# Patient Record
Sex: Female | Born: 1960 | Race: Black or African American | Hispanic: No | Marital: Single | Smoking: Never smoker
Health system: Southern US, Community
[De-identification: ages and names within clinical notes are randomized; demographics above are authoritative.]

## PROBLEM LIST (undated history)

## (undated) DIAGNOSIS — R296 Repeated falls: Secondary | ICD-10-CM

## (undated) DIAGNOSIS — I10 Essential (primary) hypertension: Secondary | ICD-10-CM

## (undated) HISTORY — PX: OVARIAN CYST REMOVAL: SHX89

## (undated) HISTORY — PX: TONSILLECTOMY: SUR1361

## (undated) HISTORY — PX: MANDIBLE SURGERY: SHX707

---

## 2004-04-26 ENCOUNTER — Other Ambulatory Visit: Admission: RE | Admit: 2004-04-26 | Discharge: 2004-04-26 | Payer: Self-pay | Admitting: Obstetrics and Gynecology

## 2014-09-10 ENCOUNTER — Encounter (HOSPITAL_BASED_OUTPATIENT_CLINIC_OR_DEPARTMENT_OTHER): Payer: Self-pay | Admitting: *Deleted

## 2014-09-10 ENCOUNTER — Emergency Department (HOSPITAL_BASED_OUTPATIENT_CLINIC_OR_DEPARTMENT_OTHER)
Admission: EM | Admit: 2014-09-10 | Discharge: 2014-09-10 | Disposition: A | Discharge status: Home / Self Care | Payer: Self-pay | Attending: Emergency Medicine | Admitting: Emergency Medicine

## 2014-09-10 DIAGNOSIS — Z79899 Other long term (current) drug therapy: Secondary | ICD-10-CM | POA: Insufficient documentation

## 2014-09-10 DIAGNOSIS — L03012 Cellulitis of left finger: Secondary | ICD-10-CM | POA: Insufficient documentation

## 2014-09-10 DIAGNOSIS — IMO0001 Reserved for inherently not codable concepts without codable children: Secondary | ICD-10-CM

## 2014-09-10 DIAGNOSIS — I10 Essential (primary) hypertension: Secondary | ICD-10-CM | POA: Insufficient documentation

## 2014-09-10 HISTORY — DX: Essential (primary) hypertension: I10

## 2014-09-10 MED ORDER — LIDOCAINE HCL 2 % IJ SOLN
10.0000 mL | Freq: Once | INTRAMUSCULAR | Status: AC
  Administered 2014-09-10: 200 mg
  Filled 2014-09-10: qty 20

## 2014-09-10 NOTE — ED Notes (Signed)
C/o pain and swelling to middle finger left hand x 3 days

## 2014-09-10 NOTE — ED Notes (Signed)
Pt c/o left 3rd finger swelling and redness x 3 days

## 2014-09-10 NOTE — ED Provider Notes (Signed)
CSN: [id number]     Arrival date & time 09/10/14  2245 History  This chart was scribed for Wynetta Fines, MD by Tula Nakayama, ED Scribe. This patient was seen in room MH02/MH02 and the patient's care was started at 11:15 PM.    Chief Complaint  Patient presents with  . Finger Pain    The history is provided by the patient. No language interpreter was used.   HPI Comments: Teresa Cruz is a 54 y.o. female with a history of HTN who presents to the Emergency Department complaining of gradual-onset swelling and pain in the tip of her left 3rd finger adjacent to the nail. It started 3 days ago. She states symptoms started after she cut her fingernails. Pt notes a history of paronychias in the past which are sometimes relieved by soaking the affected area in salt water; soaking did not help in this instance. She has had I&D of similar abscesses in the past. Pt denies fever and chills as associated symptoms. Her pain radiates proximally up her left hand.  Past Medical History  Diagnosis Date  . Hypertension    History reviewed. No pertinent past surgical history. History reviewed. No pertinent family history. History  Substance Use Topics  . Smoking status: Never Smoker   . Smokeless tobacco: Not on file  . Alcohol Use: No   OB History    No data available     Review of Systems  A complete 10 system review of systems was obtained and all systems are negative except as noted in the HPI and PMH.   Allergies  Review of patient's allergies indicates no known allergies.  Home Medications   Prior to Admission medications   Medication Sig Start Date End Date Taking? Authorizing Provider  hydrochlorothiazide (MICROZIDE) 12.5 MG capsule Take 12.5 mg by mouth daily.   Yes Historical Provider, MD  lisinopril (PRINIVIL,ZESTRIL) 10 MG tablet Take 10 mg by mouth daily.   Yes Historical Provider, MD   BP 179/95 mmHg  Pulse 98  Temp(Src) 99.5 F (37.5 C) (Oral)  Resp 20  Ht 5\' 6"  (1.676  m)  Wt 400 lb (181.439 kg)  BMI 64.59 kg/m2  SpO2 98%   Physical Exam  Nursing note and vitals reviewed. General: Well-developed, morbidly obese female in no acute distress; appearance consistent with age of record HENT: normocephalic; atraumatic Eyes: normal appearance Neck: supple Heart: regular rate and rhythm Lungs: normal respiratory effort and excursion Extremities: No deformity; full range of motion; moderate-to-severe tenderness to left 3rd finger with swelling adjacent to ulnar side of nail, no evidence of felon, no cellulitis or lymphangitis Neurologic: Awake, alert and oriented; motor function intact in all extremities and symmetric; no facial droop Skin: Warm and dry Psychiatric: Normal mood and affect   ED Course  Procedures (including critical care time) DIAGNOSTIC STUDIES: Oxygen Saturation is 98% on RA, normal by my interpretation.    COORDINATION OF CARE: 11:22 PM Discussed treatment plan with pt which includes I&D. She agreed to plan.  INCISION AND DRAINAGE Performed by: Shanon Rosser L Consent: Verbal consent obtained. Risks and benefits: risks, benefits and alternatives were discussed Type: Paronychia   Body area: Left middle finger  Anesthesia: Partial digital block  Incision was made with a scalpel.  Local anesthetic: lidocaine 2 % without epinephrine  Anesthetic total: 2 ml  Complexity: complex Blunt dissection to break up loculations  Drainage: purulent  Drainage amount: Copious; foul-smelling   Packing material: none  Patient tolerance: Patient tolerated the  procedure well with no immediate complications.     MDM   Final diagnoses:  Paronychia of third finger of left hand   I personally performed the services described in this documentation, which was scribed in my presence. The recorded information has been reviewed and is accurate.   Wynetta Fines, MD 09/10/14 [phone]

## 2014-09-10 NOTE — Discharge Instructions (Signed)

## 2014-09-11 NOTE — ED Notes (Signed)
antibiotic ointment applied to finger w a 2x2 gauge and 1 inch Kling finger tip dressing applied

## 2015-08-30 ENCOUNTER — Emergency Department (HOSPITAL_BASED_OUTPATIENT_CLINIC_OR_DEPARTMENT_OTHER)
Admission: EM | Admit: 2015-08-30 | Discharge: 2015-08-30 | Disposition: A | Discharge status: Home / Self Care | Payer: Self-pay | Attending: Emergency Medicine | Admitting: Emergency Medicine

## 2015-08-30 ENCOUNTER — Encounter (HOSPITAL_BASED_OUTPATIENT_CLINIC_OR_DEPARTMENT_OTHER): Payer: Self-pay | Admitting: *Deleted

## 2015-08-30 ENCOUNTER — Emergency Department (HOSPITAL_BASED_OUTPATIENT_CLINIC_OR_DEPARTMENT_OTHER): Payer: Self-pay

## 2015-08-30 DIAGNOSIS — Z79899 Other long term (current) drug therapy: Secondary | ICD-10-CM | POA: Insufficient documentation

## 2015-08-30 DIAGNOSIS — H538 Other visual disturbances: Secondary | ICD-10-CM | POA: Insufficient documentation

## 2015-08-30 DIAGNOSIS — H00011 Hordeolum externum right upper eyelid: Secondary | ICD-10-CM | POA: Insufficient documentation

## 2015-08-30 DIAGNOSIS — H00013 Hordeolum externum right eye, unspecified eyelid: Secondary | ICD-10-CM

## 2015-08-30 DIAGNOSIS — I10 Essential (primary) hypertension: Secondary | ICD-10-CM | POA: Insufficient documentation

## 2015-08-30 LAB — CBC WITH DIFFERENTIAL/PLATELET
BASOS PCT: 0 %
Basophils Absolute: 0 10*3/uL (ref 0.0–0.1)
EOS ABS: 0.3 10*3/uL (ref 0.0–0.7)
EOS PCT: 2 %
HCT: 38.2 % (ref 36.0–46.0)
Hemoglobin: 12.3 g/dL (ref 12.0–15.0)
LYMPHS ABS: 2.1 10*3/uL (ref 0.7–4.0)
Lymphocytes Relative: 19 %
MCH: 28 pg (ref 26.0–34.0)
MCHC: 32.2 g/dL (ref 30.0–36.0)
MCV: 87 fL (ref 78.0–100.0)
Monocytes Absolute: 1 10*3/uL (ref 0.1–1.0)
Monocytes Relative: 9 %
Neutro Abs: 7.8 10*3/uL — ABNORMAL HIGH (ref 1.7–7.7)
Neutrophils Relative %: 70 %
Platelets: 384 10*3/uL (ref 150–400)
RBC: 4.39 MIL/uL (ref 3.87–5.11)
RDW: 15.3 % (ref 11.5–15.5)
WBC: 11.2 10*3/uL — AB (ref 4.0–10.5)

## 2015-08-30 LAB — BASIC METABOLIC PANEL
Anion gap: 8 (ref 5–15)
BUN: 14 mg/dL (ref 6–20)
CALCIUM: 8.7 mg/dL — AB (ref 8.9–10.3)
CHLORIDE: 107 mmol/L (ref 101–111)
CO2: 25 mmol/L (ref 22–32)
CREATININE: 1.24 mg/dL — AB (ref 0.44–1.00)
GFR calc Af Amer: 56 mL/min — ABNORMAL LOW (ref >60)
GFR calc non Af Amer: 48 mL/min — ABNORMAL LOW (ref >60)
Glucose, Bld: 112 mg/dL — ABNORMAL HIGH (ref 65–99)
Potassium: 3.5 mmol/L (ref 3.5–5.1)
SODIUM: 140 mmol/L (ref 135–145)

## 2015-08-30 MED ORDER — LISINOPRIL 10 MG PO TABS: 10.0000 mg | ORAL_TABLET | Freq: Every day | ORAL | Status: DC

## 2015-08-30 MED ORDER — ERYTHROMYCIN 5 MG/GM OP OINT: TOPICAL_OINTMENT | Freq: Three times a day (TID) | OPHTHALMIC | Status: AC

## 2015-08-30 MED ORDER — ERYTHROMYCIN 5 MG/GM OP OINT
TOPICAL_OINTMENT | Freq: Once | OPHTHALMIC | Status: AC
  Administered 2015-08-30: 1 via OPHTHALMIC
  Filled 2015-08-30: qty 3.5

## 2015-08-30 NOTE — ED Notes (Signed)
Sty to right eye x 3-4 weeks. Denies pain. Reports drainage while she sleeps

## 2015-08-30 NOTE — ED Notes (Signed)
Pt states she has a swollen area on rt eye lid, onset approx 3 weeks ago, states on occasion in am has some drainage from area, denies any pain at this time. States has not had any vision issues

## 2015-08-30 NOTE — ED Notes (Signed)
DC instructions reviewed with pt along with both Rx as written by EDP, pt teaching done how to use eye oint, pt was able to return demonstration, enforced washing hands before and after eye oint application. Opportunity for questions provided. Teach Back Method used

## 2015-08-30 NOTE — ED Notes (Signed)
Pt reports vision has been blurred in right eye today

## 2015-08-30 NOTE — ED Provider Notes (Signed)
CSN: [id number]     Arrival date & time 08/30/15  1523 History  By signing my name below, I, Helane Gunther, attest that this documentation has been prepared under the direction and in the presence of Julianne Rice, MD. Electronically Signed: Helane Gunther, ED Scribe. 08/30/2015. 4:08 PM.    Chief Complaint  Patient presents with  . Eye Drainage  . Blurred Vision   The history is provided by the patient. No language interpreter was used.   HPI Comments: Teresa Cruz is a 55 y.o. female with a PMHx of HTN who presents to the Emergency Department complaining of intermittent right eye drainage onset 1 month ago, as well as intermittent 5 minute episodes of blurry vision in the right eye beginning today. She notes the drainage usually occurs over night, and the blurred vision occurred while looking at a computer screen. She also endorses a swollen area on the right upper eye lid. She has applied warm compresses with mild relief. She notes she is supposed to be on blood pressure medication, but has not taken any for nearly a year, since she does not have insurance and cannot afford it. She deneis any recent blood sugar tests.   Past Medical History  Diagnosis Date  . Hypertension    Past Surgical History  Procedure Laterality Date  . Ovarian cyst removal    . Tonsillectomy    . Mandible surgery     No family history on file. Social History  Substance Use Topics  . Smoking status: Never Smoker   . Smokeless tobacco: Never Used  . Alcohol Use: No   OB History    No data available     Review of Systems  Constitutional: Negative for fever and chills.  Eyes: Positive for discharge and visual disturbance.  Respiratory: Negative for shortness of breath.   Cardiovascular: Negative for chest pain.  Gastrointestinal: Negative for nausea, vomiting and abdominal pain.  Musculoskeletal: Negative for back pain, neck pain and neck stiffness.  Skin: Negative for rash and wound.   Neurological: Negative for dizziness, syncope, weakness, light-headedness, numbness and headaches.  All other systems reviewed and are negative.   Allergies  Review of patient's allergies indicates no known allergies.  Home Medications   Prior to Admission medications   Medication Sig Start Date End Date Taking? Authorizing Provider  erythromycin ophthalmic ointment Place into the left eye 3 (three) times daily. 08/30/15 09/06/15  Julianne Rice, MD  hydrochlorothiazide (MICROZIDE) 12.5 MG capsule Take 12.5 mg by mouth daily.    Historical Provider, MD  lisinopril (PRINIVIL,ZESTRIL) 10 MG tablet Take 1 tablet (10 mg total) by mouth daily. 08/30/15   Julianne Rice, MD   BP 146/85 mmHg  Pulse 75  Temp(Src) 98.4 F (36.9 C) (Oral)  Resp 20  Ht 5\' 6"  (1.676 m)  Wt 360 lb (163.295 kg)  BMI 58.13 kg/m2  SpO2 100% Physical Exam  Constitutional: She is oriented to person, place, and time. She appears well-developed and well-nourished. No distress.  HENT:  Head: Normocephalic and atraumatic.  Mouth/Throat: Oropharynx is clear and moist. No oropharyngeal exudate.  Eyes: EOM are normal. Pupils are equal, round, and reactive to light. Lids are everted and swept, no foreign bodies found. Right eye exhibits hordeolum. Right eye exhibits no discharge and no exudate. No foreign body present in the right eye. Left eye exhibits no discharge, no exudate and no hordeolum. No foreign body present in the left eye. Right conjunctiva is not injected. Right conjunctiva has no  hemorrhage. Left conjunctiva is not injected. Left conjunctiva has no hemorrhage. Right eye exhibits normal extraocular motion and no nystagmus. Left eye exhibits normal extraocular motion and no nystagmus.    Retinal exam without obvious abnormality though difficult to visualize thoroughly without pupillary dilation  Neck: Normal range of motion. Neck supple.  Cardiovascular: Normal rate and regular rhythm.  Exam reveals no gallop  and no friction rub.   No murmur heard. Pulmonary/Chest: Effort normal and breath sounds normal. No respiratory distress. She has no wheezes. She has no rales.  Abdominal: Soft. Bowel sounds are normal. She exhibits no distension and no mass. There is no tenderness. There is no rebound and no guarding.  Musculoskeletal: Normal range of motion. She exhibits no edema or tenderness.  Lymphadenopathy:    She has no cervical adenopathy.  Neurological: She is alert and oriented to person, place, and time.  Patient is alert and oriented x3 with clear, goal oriented speech. Patient has 5/5 motor in all extremities. Sensation is intact to light touch. Bilateral finger-to-nose is normal with no signs of dysmetria. Patient has a normal gait and walks without assistance.  Skin: Skin is warm and dry. No rash noted. No erythema.  Psychiatric: She has a normal mood and affect. Her behavior is normal.  Nursing note and vitals reviewed.   ED Course  Procedures  DIAGNOSTIC STUDIES: Oxygen Saturation is 99% on RA, normal by my interpretation.    COORDINATION OF CARE: 4:06 PM - Discussed plans to order diagnostic studies. Pt advised of plan for treatment and pt agrees.  Labs Review Labs Reviewed  BASIC METABOLIC PANEL - Abnormal; Notable for the following:    Glucose, Bld 112 (*)    Creatinine, Ser 1.24 (*)    Calcium 8.7 (*)    GFR calc non Af Amer 48 (*)    GFR calc Af Amer 56 (*)    All other components within normal limits  CBC WITH DIFFERENTIAL/PLATELET - Abnormal; Notable for the following:    WBC 11.2 (*)    Neutro Abs 7.8 (*)    All other components within normal limits    Imaging Review Ct Head Wo Contrast  08/30/2015  CLINICAL DATA:  Headaches, dizziness and elevated blood pressure. EXAM: CT HEAD WITHOUT CONTRAST TECHNIQUE: Contiguous axial images were obtained from the base of the skull through the vertex without intravenous contrast. COMPARISON:  None. FINDINGS: The ventricles are  normal in size and configuration. No extra-axial fluid collections are identified. The gray-white differentiation is normal. No CT findings for acute intracranial process such as hemorrhage or infarction. No mass lesions. The brainstem and cerebellum are grossly normal. The bony structures are intact. The paranasal sinuses and mastoid air cells are clear. The globes are intact. A small stye is noted involving the lower right eyelid. IMPRESSION: Normal head CT. Electronically Signed   By: Marijo Sanes M.D.   On: 08/30/2015 16:48   I have personally reviewed and evaluated these images and lab results as part of my medical decision-making.   EKG Interpretation None      MDM   Final diagnoses:  Hordeolum external, right  Essential hypertension  Blurred vision    I personally performed the services described in this documentation, which was scribed in my presence. The recorded information has been reviewed and is accurate.   CT head without any abnormalities that it identified. Patient maintains a normal neurologic exam. We will start on erythromycin ointment for her hordeolum revised to continue warm  compresses. We'll also start back on the patient's lisinopril. She's been advised to follow-up with a chronic care physician as well as an ophthalmologist. States that she had been wearing prescription glasses but she is no longer using them. Return precautions given.  Julianne Rice, MD 08/30/15 [phone]

## 2015-08-30 NOTE — Discharge Instructions (Signed)
Stye A stye is a bump on your eyelid caused by a bacterial infection. A stye can form inside the eyelid (internal stye) or outside the eyelid (external stye). An internal stye may be caused by an infected oil-producing gland inside your eyelid. An external stye may be caused by an infection at the base of your eyelash (hair follicle). Styes are very common. Anyone can get them at any age. They usually occur in just one eye, but you may have more than one in either eye.  CAUSES  The infection is almost always caused by bacteria called Staphylococcus aureus. This is a common type of bacteria that lives on your skin. RISK FACTORS You may be at higher risk for a stye if you have had one before. You may also be at higher risk if you have:  Diabetes.  Long-term illness.  Long-term eye redness.  A skin condition called seborrhea.  High fat levels in your blood (lipids). SIGNS AND SYMPTOMS  Eyelid pain is the most common symptom of a stye. Internal styes are more painful than external styes. Other signs and symptoms may include:  Painful swelling of your eyelid.  A scratchy feeling in your eye.  Tearing and redness of your eye.  Pus draining from the stye. DIAGNOSIS  Your health care provider may be able to diagnose a stye just by examining your eye. The health care provider may also check to make sure:  You do not have a fever or other signs of a more serious infection.  The infection has not spread to other parts of your eye or areas around your eye. TREATMENT  Most styes will clear up in a few days without treatment. In some cases, you may need to use antibiotic drops or ointment to prevent infection. Your health care provider may have to drain the stye surgically if your stye is:  Large.  Causing a lot of pain.  Interfering with your vision. This can be done using a thin blade or a needle.  HOME CARE INSTRUCTIONS   Take medicines only as directed by your health care  provider.  Apply a clean, warm compress to your eye for 10 minutes, 4 times a day.  Do not wear contact lenses or eye makeup until your stye has healed.  Do not try to pop or drain the stye. SEEK MEDICAL CARE IF:  You have chills or a fever.  Your stye does not go away after several days.  Your stye affects your vision.  Your eyeball becomes swollen, red, or painful. MAKE SURE YOU:  Understand these instructions.  Will watch your condition.  Will get help right away if you are not doing well or get worse.   This information is not intended to replace advice given to you by your health care provider. Make sure you discuss any questions you have with your health care provider.   Document Released: 05/04/2005 Document Revised: 08/15/2014 Document Reviewed: 11/08/2013 Elsevier Interactive Patient Education 2016 [employer].  Hypertension Hypertension, commonly called high blood pressure, is when the force of blood pumping through your arteries is too strong. Your arteries are the blood vessels that carry blood from your heart throughout your body. A blood pressure reading consists of a higher number over a lower number, such as 110/72. The higher number (systolic) is the pressure inside your arteries when your heart pumps. The lower number (diastolic) is the pressure inside your arteries when your heart relaxes. Ideally you want your blood pressure below  120/80. Hypertension forces your heart to work harder to pump blood. Your arteries may become narrow or stiff. Having untreated or uncontrolled hypertension can cause heart attack, stroke, kidney disease, and other problems. RISK FACTORS Some risk factors for high blood pressure are controllable. Others are not.  Risk factors you cannot control include:   Race. You may be at higher risk if you are African American.  Age. Risk increases with age.  Gender. Men are at higher risk than women before age 81 years. After age 50, women  are at higher risk than men. Risk factors you can control include:  Not getting enough exercise or physical activity.  Being overweight.  Getting too much fat, sugar, calories, or salt in your diet.  Drinking too much alcohol. SIGNS AND SYMPTOMS Hypertension does not usually cause signs or symptoms. Extremely high blood pressure (hypertensive crisis) may cause headache, anxiety, shortness of breath, and nosebleed. DIAGNOSIS To check if you have hypertension, your health care provider will measure your blood pressure while you are seated, with your arm held at the level of your heart. It should be measured at least twice using the same arm. Certain conditions can cause a difference in blood pressure between your right and left arms. A blood pressure reading that is higher than normal on one occasion does not mean that you need treatment. If it is not clear whether you have high blood pressure, you may be asked to return on a different day to have your blood pressure checked again. Or, you may be asked to monitor your blood pressure at home for 1 or more weeks. TREATMENT Treating high blood pressure includes making lifestyle changes and possibly taking medicine. Living a healthy lifestyle can help lower high blood pressure. You may need to change some of your habits. Lifestyle changes may include:  Following the DASH diet. This diet is high in fruits, vegetables, and whole grains. It is low in salt, red meat, and added sugars.  Keep your sodium intake below 2,300 mg per day.  Getting at least 30-45 minutes of aerobic exercise at least 4 times per week.  Losing weight if necessary.  Not smoking.  Limiting alcoholic beverages.  Learning ways to reduce stress. Your health care provider may prescribe medicine if lifestyle changes are not enough to get your blood pressure under control, and if one of the following is true:  You are 32-13 years of age and your systolic blood pressure is  above 140.  You are 33 years of age or older, and your systolic blood pressure is above 150.  Your diastolic blood pressure is above 90.  You have diabetes, and your systolic blood pressure is over [id number] or your diastolic blood pressure is over 90.  You have kidney disease and your blood pressure is above 140/90.  You have heart disease and your blood pressure is above 140/90. Your personal target blood pressure may vary depending on your medical conditions, your age, and other factors. HOME CARE INSTRUCTIONS  Have your blood pressure rechecked as directed by your health care provider.   Take medicines only as directed by your health care provider. Follow the directions carefully. Blood pressure medicines must be taken as prescribed. The medicine does not work as well when you skip doses. Skipping doses also puts you at risk for problems.  Do not smoke.   Monitor your blood pressure at home as directed by your health care provider. SEEK MEDICAL CARE IF:   You think you  are having a reaction to medicines taken.  You have recurrent headaches or feel dizzy.  You have swelling in your ankles.  You have trouble with your vision. SEEK IMMEDIATE MEDICAL CARE IF:  You develop a severe headache or confusion.  You have unusual weakness, numbness, or feel faint.  You have severe chest or abdominal pain.  You vomit repeatedly.  You have trouble breathing. MAKE SURE YOU:   Understand these instructions.  Will watch your condition.  Will get help right away if you are not doing well or get worse.   This information is not intended to replace advice given to you by your health care provider. Make sure you discuss any questions you have with your health care provider.   Document Released: 07/25/2005 Document Revised: 12/09/2014 Document Reviewed: 05/17/2013 Elsevier Interactive Patient Education [employer].

## 2015-09-15 ENCOUNTER — Emergency Department (HOSPITAL_BASED_OUTPATIENT_CLINIC_OR_DEPARTMENT_OTHER)
Admission: EM | Admit: 2015-09-15 | Discharge: 2015-09-15 | Disposition: A | Discharge status: Home / Self Care | Payer: Self-pay | Attending: Emergency Medicine | Admitting: Emergency Medicine

## 2015-09-15 ENCOUNTER — Encounter (HOSPITAL_BASED_OUTPATIENT_CLINIC_OR_DEPARTMENT_OTHER): Payer: Self-pay | Admitting: *Deleted

## 2015-09-15 ENCOUNTER — Emergency Department (HOSPITAL_BASED_OUTPATIENT_CLINIC_OR_DEPARTMENT_OTHER): Payer: Self-pay

## 2015-09-15 DIAGNOSIS — R109 Unspecified abdominal pain: Secondary | ICD-10-CM

## 2015-09-15 DIAGNOSIS — I10 Essential (primary) hypertension: Secondary | ICD-10-CM | POA: Insufficient documentation

## 2015-09-15 DIAGNOSIS — E669 Obesity, unspecified: Secondary | ICD-10-CM | POA: Insufficient documentation

## 2015-09-15 DIAGNOSIS — M79621 Pain in right upper arm: Secondary | ICD-10-CM | POA: Insufficient documentation

## 2015-09-15 DIAGNOSIS — R1011 Right upper quadrant pain: Secondary | ICD-10-CM | POA: Insufficient documentation

## 2015-09-15 LAB — COMPREHENSIVE METABOLIC PANEL
ALBUMIN: 2.9 g/dL — AB (ref 3.5–5.0)
ALT: 13 U/L — AB (ref 14–54)
AST: 19 U/L (ref 15–41)
Alkaline Phosphatase: 65 U/L (ref 38–126)
Anion gap: 9 (ref 5–15)
BILIRUBIN TOTAL: 0.5 mg/dL (ref 0.3–1.2)
BUN: 14 mg/dL (ref 6–20)
CO2: 26 mmol/L (ref 22–32)
CREATININE: 0.99 mg/dL (ref 0.44–1.00)
Calcium: 8.8 mg/dL — ABNORMAL LOW (ref 8.9–10.3)
Chloride: 108 mmol/L (ref 101–111)
GFR calc Af Amer: >60 mL/min (ref >60)
GFR calc non Af Amer: >60 mL/min (ref >60)
GLUCOSE: 95 mg/dL (ref 65–99)
POTASSIUM: 3.5 mmol/L (ref 3.5–5.1)
Sodium: 143 mmol/L (ref 135–145)
TOTAL PROTEIN: 6.7 g/dL (ref 6.5–8.1)

## 2015-09-15 LAB — URINE MICROSCOPIC-ADD ON

## 2015-09-15 LAB — URINALYSIS, ROUTINE W REFLEX MICROSCOPIC
BILIRUBIN URINE: NEGATIVE
GLUCOSE, UA: NEGATIVE mg/dL
Ketones, ur: NEGATIVE mg/dL
Nitrite: NEGATIVE
Protein, ur: NEGATIVE mg/dL
Specific Gravity, Urine: 1.011 (ref 1.005–1.030)
pH: 7 (ref 5.0–8.0)

## 2015-09-15 LAB — CBC WITH DIFFERENTIAL/PLATELET
Basophils Absolute: 0 10*3/uL (ref 0.0–0.1)
Basophils Relative: 0 %
EOS ABS: 0.2 10*3/uL (ref 0.0–0.7)
EOS PCT: 3 %
HCT: 35 % — ABNORMAL LOW (ref 36.0–46.0)
Hemoglobin: 11 g/dL — ABNORMAL LOW (ref 12.0–15.0)
LYMPHS ABS: 1.5 10*3/uL (ref 0.7–4.0)
Lymphocytes Relative: 19 %
MCH: 27.5 pg (ref 26.0–34.0)
MCHC: 31.4 g/dL (ref 30.0–36.0)
MCV: 87.5 fL (ref 78.0–100.0)
MONO ABS: 0.7 10*3/uL (ref 0.1–1.0)
Monocytes Relative: 9 %
Neutro Abs: 5.5 10*3/uL (ref 1.7–7.7)
Neutrophils Relative %: 69 %
PLATELETS: 365 10*3/uL (ref 150–400)
RBC: 4 MIL/uL (ref 3.87–5.11)
RDW: 14.8 % (ref 11.5–15.5)
WBC: 8 10*3/uL (ref 4.0–10.5)

## 2015-09-15 LAB — LIPASE, BLOOD: Lipase: 26 U/L (ref 11–51)

## 2015-09-15 NOTE — Discharge Instructions (Signed)
You can take ibuprofen or tylenol available over the counter according to label instructions as needed for your side pain.  Get rechecked immediately if you develop fevers, chest pain, difficulty breathing, vomiting, or new concerning symptoms.  You had an ultrasound today and you will need a repeat ultrasound in the next five years to look at your aorta.    Abdominal Pain, Adult Many things can cause abdominal pain. Usually, abdominal pain is not caused by a disease and will improve without treatment. It can often be observed and treated at home. Your health care provider will do a physical exam and possibly order blood tests and X-rays to help determine the seriousness of your pain. However, in many cases, more time must pass before a clear cause of the pain can be found. Before that point, your health care provider may not know if you need more testing or further treatment. HOME CARE INSTRUCTIONS Monitor your abdominal pain for any changes. The following actions may help to alleviate any discomfort you are experiencing:  Only take over-the-counter or prescription medicines as directed by your health care provider.  Do not take laxatives unless directed to do so by your health care provider.  Try a clear liquid diet (broth, tea, or water) as directed by your health care provider. Slowly move to a bland diet as tolerated. SEEK MEDICAL CARE IF:  You have unexplained abdominal pain.  You have abdominal pain associated with nausea or diarrhea.  You have pain when you urinate or have a bowel movement.  You experience abdominal pain that wakes you in the night.  You have abdominal pain that is worsened or improved by eating food.  You have abdominal pain that is worsened with eating fatty foods.  You have a fever. SEEK IMMEDIATE MEDICAL CARE IF:  Your pain does not go away within 2 hours.  You keep throwing up (vomiting).  Your pain is felt only in portions of the abdomen, such as the  right side or the left lower portion of the abdomen.  You pass bloody or black tarry stools. MAKE SURE YOU:  Understand these instructions.  Will watch your condition.  Will get help right away if you are not doing well or get worse.   This information is not intended to replace advice given to you by your health care provider. Make sure you discuss any questions you have with your health care provider.   Document Released: 05/04/2005 Document Revised: 04/15/2015 Document Reviewed: 04/03/2013 Elsevier Interactive Patient Education 2016 Elsevier Inc.  Flank Pain Flank pain refers to pain that is located on the side of the body between the upper abdomen and the back. The pain may occur over a short period of time (acute) or may be long-term or reoccurring (chronic). It may be mild or severe. Flank pain can be caused by many things. CAUSES  Some of the more common causes of flank pain include:  Muscle strains.   Muscle spasms.   A disease of your spine (vertebral disk disease).   A lung infection (pneumonia).   Fluid around your lungs (pulmonary edema).   A kidney infection.   Kidney stones.   A very painful skin rash caused by the chickenpox virus (shingles).   Gallbladder disease.  [place] care will depend on the cause of your pain. In general,  Rest as directed by your caregiver.  Drink enough fluids to keep your urine clear or pale yellow.  Only take over-the-counter or  prescription medicines as directed by your caregiver. Some medicines may help relieve the pain.  Tell your caregiver about any changes in your pain.  Follow up with your caregiver as directed. SEEK IMMEDIATE MEDICAL CARE IF:   Your pain is not controlled with medicine.   You have new or worsening symptoms.  Your pain increases.   You have abdominal pain.   You have shortness of breath.   You have persistent nausea or vomiting.   You have swelling in  your abdomen.   You feel faint or pass out.   You have blood in your urine.  You have a fever or persistent symptoms for more than 2-3 days.  You have a fever and your symptoms suddenly get worse. MAKE SURE YOU:   Understand these instructions.  Will watch your condition.  Will get help right away if you are not doing well or get worse.   This information is not intended to replace advice given to you by your health care provider. Make sure you discuss any questions you have with your health care provider.   Document Released: 09/15/2005 Document Revised: 04/18/2012 Document Reviewed: 03/08/2012 Elsevier Interactive Patient Education 2016 [employer].   Emergency Department Resource Guide 1) Find a Doctor and Pay Out of Pocket Although you won't have to find out who is covered by your insurance plan, it is a good idea to ask around and get recommendations. You will then need to call the office and see if the doctor you have chosen will accept you as a new patient and what types of options they offer for patients who are self-pay. Some doctors offer discounts or will set up payment plans for their patients who do not have insurance, but you will need to ask so you aren't surprised when you get to your appointment.  2) Contact Your Local Health Department Not all health departments have doctors that can see patients for sick visits, but many do, so it is worth a call to see if yours does. If you don't know where your local health department is, you can check in your phone book. The CDC also has a tool to help you locate your state's health department, and many state websites also have listings of all of their local health departments.  3) Find a [facility] If your illness is not likely to be very severe or complicated, you may want to try a walk in clinic. These are popping up all over the country in pharmacies, drugstores, and shopping centers. They're usually staffed by nurse  practitioners or physician assistants that have been trained to treat common illnesses and complaints. They're usually fairly quick and inexpensive. However, if you have serious medical issues or chronic medical problems, these are probably not your best option.  No Primary Care Doctor: - Call Health Connect at  [phone] - they can help you locate a primary care doctor that  accepts your insurance, provides certain services, etc. - Physician Referral Service- [phone]  Chronic Pain Problems: Organization         Address  Phone   Notes  [facility]  [phone] Patients need to be referred by their primary care doctor.   Medication Assistance: Organization         Address  Phone   Notes  [facility] Medication [facility] [address] [place],  [address] [phone] --Must be a resident of [facility] -- Must have NO insurance  coverage whatsoever (no Medicaid/ Medicare, etc.) -- The pt. MUST have a primary care doctor that directs their care regularly and follows them in the community   MedAssist  [phone]   [employer]  [phone]    Agencies that provide inexpensive medical care: Organization         Address  Phone   Notes  [place]  [phone]   [facility]    [phone]   [facility] [place], [place] [address] [phone]   [place] [address], [place] [phone]   Planned Parenthood    [phone]   [facility]    [phone]   [place] and [place] [address], [place] Phone:  [phone], Fax:  [phone] Hours of Operation:  9 am - 6 pm, M-F.  Also accepts Medicaid/Medicare and self-pay.  [facility] for [place], [address], [place] Phone: [phone], Fax: [phone]. Hours of Operation:  8:30 am -  5:30 pm, M-F.  Also accepts Medicaid and self-pay.  [facility] [place] [address], [place] Phone: [phone]   [place], [place], [place] [phone], Ext. [phone] Mondays & Thursdays: 7-9 AM.  First 15 patients are seen on a first come, first serve basis.    [place] Providers:  Organization         Address  Phone   Notes  [facility] [address], [place] [phone] Also accepts self-pay patients.  [facility] [id number] [place], [place]  [phone]   [place], [address], [place] [phone]   [facility] [address], [place] [phone]   Lucianne Lei [address], [place]   [phone] Only accepts [place] Access [place] patients after they have their name applied to their card.   Self-Pay (no insurance) in [facility]:  Organization         Address  Phone   Notes  Sickle Cell Patients, [facility] [place] [phone]   [facility] [place] [phone]   [facility] [place]  [place], [place], [place] [phone]   [facility]/Dr. Osei-Bonsu  [address], [place] or [address], [place] [phone] Phone number for both [place] and [place] locations is the same.  [facility] [address], [place] [phone]   [facility] [address] [place] or [address] [phone]   [facility] [address], [place] [phone], phone; [phone], fax Sees patients 1st and 3rd Saturday of every month.  Must not qualify for public or private insurance (i.e. Medicaid, Medicare, [place] Health Choice,  Veterans' Benefits)  Household income should be no more than 200% of the poverty level The clinic cannot treat you if you are pregnant or think you are pregnant  Sexually transmitted diseases are not treated at the clinic.    Dental Care: Organization         Address  Phone  Notes  [facility] Department  of [facility] [address] [place] [phone] Accepts children up to age 44 who are enrolled in [place] or [place]; pregnant women with a Medicaid card; and children who have applied for Medicaid or [place] Health Choice, but were declined, whose parents can pay a reduced fee at time of service.  [facility] Department of [facility]  [address], [place] [phone] Accepts children up to age 84 who are enrolled in [place] or [place]; pregnant women with a Medicaid card; and children who have applied for Medicaid or [place] Health Choice, but were declined, whose parents can pay a reduced fee at time of service.  [place] Adult Dental Access PROGRAM  [place] [phone] Patients are seen by appointment only. Walk-ins are not accepted. [place] will see patients 84 years of age and older. Monday - Tuesday (8am-5pm) Most Wednesdays (8:30-5pm) $30 per visit, cash only  [facility] Adult Dental Access PROGRAM  [address], [facility] [phone] Patients are seen by appointment only. Walk-ins are not accepted. [place] will see patients 36 years of age and older. One Wednesday Evening (Monthly: Volunteer Based).  $30 per visit, cash only  [place]  [phone] for adults; Children under age 32, call Graduate Pediatric Dentistry at [phone]. Children aged 66-14, please call [phone] to request a pediatric application.  Dental services are provided in all areas of dental care including fillings, crowns and bridges, complete and  partial dentures, implants, gum treatment, root canals, and extractions. Preventive care is also provided. Treatment is provided to both adults and children. Patients are selected via a lottery and there is often a waiting list.   [facility] [address] [place]  [phone] [email or web address]   [facility] [address] [place], [place] [phone], Ext. [phone] Second and Fourth Thursday of each month, opens at 6:30 AM; Clinic ends at 9 AM.  Patients are seen on a first-come first-served basis, and a limited number are seen during each clinic.   [facility]  [address] Hillard Danker [place], [place] [phone]   Eligibility Requirements You must have lived in [place], [place], or [place] counties for at least the last three months.   You cannot be eligible for state or federal sponsored [employer], including [employer], [place], or [employer].   You generally cannot be eligible for healthcare insurance through your employer.    How to apply: Eligibility screenings are held every Tuesday and Wednesday afternoon from 1:00 pm until 4:00 pm. You do not need an appointment for the interview!  [facility] [address] [place], [place]   [place]  [place] Department  [place]  [phone]    Behavioral Health Resources in the Community: Intensive Outpatient Programs Organization         Address  Phone  Notes  [place]. [address] [place], [place] [phone]   [facility] [address] [place], [place]   ADS: Alcohol & Drug Svcs [address] [place], [place]   [place] [address]  [place], [place] or [phone]   Substance Abuse Resources Organization  Address  Phone  Notes  Alcohol and Drug Services  [phone]   [place]  [phone]   [facility] [place]  [phone]   Chinita Pester  [phone]   Residential & Outpatient Substance Abuse Program  [phone]   Psychological Services Organization         Address  Phone  Notes  [facility] [place]  [place]  [phone]   [place] [address], [place] or [phone]    Mobile Crisis Teams Organization         Address  Phone  Notes  [facility], Mobile Crisis Care Unit  [phone]   [facility]  [address] [place], [place]   [place] [address] [place] [phone]    Self-Help/Support Groups Organization         Address  Phone             Notes  [place]. of [place] - variety of support groups  [place] Call for more information  Narcotics Anonymous (NA), [facility] [address], [employer] [place]  2 meetings at this location   Special educational needs teacher         Address  Phone  Notes  [facility]idential [facility] [place],    [place]  [phone]   [facility]  [address] [place] [id number], [place], [place]   [place], [place] [phone] Admissions: 8am-3pm M-F  Incentives Substance [place] [address],    [place], [place] [phone]   [facility] [address] [place], [place], [place]   [facility] [address]  [place], [place]   [facility] Programs - Intensive Outpatient [address], [place], [place]   [facility] [place].,  [place], [place] [phone] or [phone]   [facility] (RTS) [address] [place], [place] Accepts Medicaid  [facility] [place] [address],  [place] [phone] Substance Abuse/Addiction Treatment   [facility] Organization         Address  Phone  Notes  [facility]  [phone]   Domenic Schwab, PhD [address] Arlis Porta [place], [place]   [phone] or [phone]   [place], [place] [phone]   [facility] [address] [place], [place] [phone] Insurance/Medicaid/sponsorship through [facility] and Families [address] [place]                                    [place], [place] [phone] [place] [address], [place] [phone]    Dr. Adele Schilder  [phone]   [facility] Dept. 1) [address] [place] 2) [place] 3)  [place] [address], [place] [phone]  [phone]   [place] [phone] or [phone] (After Hours)

## 2015-09-15 NOTE — ED Notes (Addendum)
C/o upper right side pain x 1 week with sharpness. Pain is not there at rest and returns with movement. No injury. Taken ibuprofen at home this morning.

## 2015-09-15 NOTE — ED Notes (Signed)
Dr Ralene Bathe aware of high BP. Okay to d/c pt.

## 2015-09-15 NOTE — ED Provider Notes (Signed)
CSN: [id number]     Arrival date & time 09/15/15  [id number] History   First MD Initiated Contact with Patient 09/15/15 [phone]     No chief complaint on file.     The history is provided by the patient. No language interpreter was used.   Teresa Cruz is a 55 y.o. female who presents to the Emergency Department complaining of right side pain. She reports one week of intermittent right side pain. The pain is located along her right upper quadrant and right mid axillary line. Pain comes and goes but is worse with activity. She recalls no injuries. She has no fevers, nausea, vomiting, diarrhea, dysuria, chest pain, cough, SOB. No prior similar symptoms. Symptoms are moderate, waxing and waning, worsening.  Past Medical History  Diagnosis Date  . Hypertension    Past Surgical History  Procedure Laterality Date  . Ovarian cyst removal    . Tonsillectomy    . Mandible surgery     No family history on file. Social History  Substance Use Topics  . Smoking status: Never Smoker   . Smokeless tobacco: Never Used  . Alcohol Use: No   OB History    No data available     Review of Systems  All other systems reviewed and are negative.     Allergies  Review of patient's allergies indicates no known allergies.  Home Medications   Prior to Admission medications   Medication Sig Start Date End Date Taking? Authorizing Provider  lisinopril (PRINIVIL,ZESTRIL) 10 MG tablet Take 1 tablet (10 mg total) by mouth daily. 08/30/15  Yes Julianne Rice, MD   BP 168/110 mmHg  Pulse 68  Temp(Src) 98.2 F (36.8 C) (Oral)  Resp 16  Ht 5\' 6"  (1.676 m)  Wt 360 lb (163.295 kg)  BMI 58.13 kg/m2  SpO2 100% Physical Exam  Constitutional: She is oriented to person, place, and time. She appears well-developed and well-nourished.  Obese  HENT:  Head: Normocephalic and atraumatic.  Cardiovascular: Normal rate and regular rhythm.   No murmur heard. Pulmonary/Chest: Effort normal and breath sounds normal.  No respiratory distress.  Abdominal: Soft. There is no rebound and no guarding.  Mild right upper quadrant tenderness  Musculoskeletal: She exhibits no edema or tenderness.  Neurological: She is alert and oriented to person, place, and time.  Skin: Skin is warm and dry. No rash noted.  Psychiatric: She has a normal mood and affect. Her behavior is normal.  Nursing note and vitals reviewed.   ED Course  Procedures (including critical care time) Labs Review Labs Reviewed  COMPREHENSIVE METABOLIC PANEL - Abnormal; Notable for the following:    Calcium 8.8 (*)    Albumin 2.9 (*)    ALT 13 (*)    All other components within normal limits  URINALYSIS, ROUTINE W REFLEX MICROSCOPIC (NOT AT [facility]) - Abnormal; Notable for the following:    Hgb urine dipstick TRACE (*)    Leukocytes, UA SMALL (*)    All other components within normal limits  CBC WITH DIFFERENTIAL/PLATELET - Abnormal; Notable for the following:    Hemoglobin 11.0 (*)    HCT 35.0 (*)    All other components within normal limits  URINE MICROSCOPIC-ADD ON - Abnormal; Notable for the following:    Squamous Epithelial / LPF 0-5 (*)    Bacteria, UA FEW (*)    All other components within normal limits  LIPASE, BLOOD    Imaging Review US Abdomen Complete  09/15/2015  CLINICAL DATA:  Right-sided pain EXAM: ABDOMEN ULTRASOUND COMPLETE COMPARISON:  None. FINDINGS: Gallbladder: No gallstones or wall thickening visualized. No sonographic Murphy sign noted by sonographer. Common bile duct: Diameter: 4 mm Liver: No focal lesion identified. Within normal limits in parenchymal echogenicity. IVC: Limited visualization secondary to overlying bowel gas. Pancreas: Limited visualization secondary to overlying bowel gas. Spleen: Limited visualization secondary to overlying bowel gas. Right Kidney: Length: 9.2 cm. Echogenicity within normal limits. No mass or hydronephrosis visualized. Left Kidney: Length: 9.2 cm. Echogenicity within normal limits. No  mass or hydronephrosis visualized. Abdominal aorta: 2.9 cm in greatest AP diameter. Other findings: None. IMPRESSION: 1. Limited evaluation secondary to overlying bowel gas and body habitus. 2. No cholelithiasis or sonographic evidence of acute cholecystitis. 3. Ectatic abdominal aorta at risk for aneurysm development. Recommend followup by ultrasound in 5 years. This recommendation follows ACR consensus guidelines: White Paper of the ACR Incidental Findings Committee II on Vascular Findings. J Am Coll Radiol 2013; 10:789-794. Electronically Signed   By: Kathreen Devoid   On: 09/15/2015 08:54   I have personally reviewed and evaluated these images and lab results as part of my medical decision-making.   EKG Interpretation None      MDM   Final diagnoses:  Acute right flank pain    Patient here for evaluation of right sided flank pain. Pain is reproducible on examination with no overlying rash. This is above her upper abdominal wall as well as her lower rib margin. Presentation is not consistent with obstructing kidney stone or cholecystitis. No evidence of pneumonia on history or exam. Abdominal ultrasound was ectatic abdominal aorta but no evidence of aneurysm. Discussed with patient findings of ultrasound and importance of follow-up imaging. Presentation is not consistent with dissection.  Quintella Reichert, MD 09/15/15 [phone]

## 2015-10-24 ENCOUNTER — Emergency Department (HOSPITAL_BASED_OUTPATIENT_CLINIC_OR_DEPARTMENT_OTHER)
Admission: EM | Admit: 2015-10-24 | Discharge: 2015-10-25 | Disposition: A | Discharge status: Home / Self Care | Payer: BLUE CROSS/BLUE SHIELD | Attending: Emergency Medicine | Admitting: Emergency Medicine

## 2015-10-24 ENCOUNTER — Encounter (HOSPITAL_BASED_OUTPATIENT_CLINIC_OR_DEPARTMENT_OTHER): Payer: Self-pay | Admitting: Emergency Medicine

## 2015-10-24 ENCOUNTER — Emergency Department (HOSPITAL_BASED_OUTPATIENT_CLINIC_OR_DEPARTMENT_OTHER): Payer: BLUE CROSS/BLUE SHIELD

## 2015-10-24 DIAGNOSIS — Z79899 Other long term (current) drug therapy: Secondary | ICD-10-CM | POA: Diagnosis not present

## 2015-10-24 DIAGNOSIS — I10 Essential (primary) hypertension: Secondary | ICD-10-CM | POA: Insufficient documentation

## 2015-10-24 DIAGNOSIS — R42 Dizziness and giddiness: Secondary | ICD-10-CM | POA: Diagnosis not present

## 2015-10-24 DIAGNOSIS — Y998 Other external cause status: Secondary | ICD-10-CM | POA: Diagnosis not present

## 2015-10-24 DIAGNOSIS — Y9289 Other specified places as the place of occurrence of the external cause: Secondary | ICD-10-CM | POA: Diagnosis not present

## 2015-10-24 DIAGNOSIS — S8991XA Unspecified injury of right lower leg, initial encounter: Secondary | ICD-10-CM | POA: Diagnosis not present

## 2015-10-24 DIAGNOSIS — Y9389 Activity, other specified: Secondary | ICD-10-CM | POA: Diagnosis not present

## 2015-10-24 DIAGNOSIS — W07XXXA Fall from chair, initial encounter: Secondary | ICD-10-CM | POA: Diagnosis not present

## 2015-10-24 DIAGNOSIS — M25561 Pain in right knee: Secondary | ICD-10-CM

## 2015-10-24 MED ORDER — HYDROCODONE-ACETAMINOPHEN 5-325 MG PO TABS: 1.0000 | ORAL_TABLET | ORAL | Status: DC | PRN

## 2015-10-24 NOTE — ED Notes (Signed)
Pt in c/o R knee pain after falling today. Was able to get up herself and ambulate today but pain has gotten worse throughout the day.

## 2015-10-24 NOTE — ED Provider Notes (Signed)
CSN: [id number]     Arrival date & time 10/24/15  2025 History  By signing my name below, I, [facility], attest that this documentation has been prepared under the direction and in the presence of Merrily Pew, MD. Electronically Signed: Virgel Bouquet, ED Scribe. 10/24/2015. 11:32 PM.    Chief Complaint  Patient presents with  . Fall  . Knee Pain   The history is provided by the patient. No language interpreter was used.   HPI Comments: Teresa Cruz is a 55 y.o. female who presents to the Emergency Department complaining of gradual onset, gradually worsening mild right knee pain that presents with bearing weight onset earlier today after a fall. Patient reports that she fell this morning while trying to get out of rolling office chair and rolled over onto her knees to get up. Asymptomatic until she was at work when [place] gradually progressed to the point that she was unable to walk. Patient ambulates normally with a cane. She endorses light-headness intermittently today. Per patient, she has an hx of HTN and has just recently restarted taking this medication. Denies twisting her knee, hearing a pop, LOC, dizziness, hip pain.  Past Medical History  Diagnosis Date  . Hypertension    Past Surgical History  Procedure Laterality Date  . Ovarian cyst removal    . Tonsillectomy    . Mandible surgery     History reviewed. No pertinent family history. Social History  Substance Use Topics  . Smoking status: Never Smoker   . Smokeless tobacco: Never Used  . Alcohol Use: No   OB History    No data available     Review of Systems  Genitourinary: Vaginal pain: right knee.  Musculoskeletal: Positive for arthralgias.  Neurological: Positive for light-headedness. Negative for dizziness.      Allergies  Review of patient's allergies indicates no known allergies.  Home Medications   Prior to Admission medications   Medication Sig Start Date End Date Taking?  Authorizing Provider  HYDROcodone-acetaminophen (NORCO) 5-325 MG tablet Take 1-2 tablets by mouth every 4 (four) hours as needed. 10/24/15   Merrily Pew, MD  lisinopril (PRINIVIL,ZESTRIL) 10 MG tablet Take 1 tablet (10 mg total) by mouth daily. 08/30/15   Julianne Rice, MD   BP 153/83 mmHg  Pulse 74  Temp(Src) 98.1 F (36.7 C) (Oral)  Resp 20  Ht 5\' 6"  (1.676 m)  Wt 313 lb (141.976 kg)  BMI 50.54 kg/m2  SpO2 100% Physical Exam  Constitutional: She is oriented to person, place, and time. She appears well-developed and well-nourished. No distress.  HENT:  Head: Normocephalic and atraumatic.  Eyes: Conjunctivae and EOM are normal.  Neck: Neck supple. No tracheal deviation present.  Cardiovascular: Normal rate.   Pulmonary/Chest: Effort normal. No respiratory distress.  Musculoskeletal: Normal range of motion.  No pain with palpation or ROM of right hip and right knee. Good capillary refill in right foot. Ambulated with hesitation but without pain.  Neurological: She is alert and oriented to person, place, and time.  Skin: Skin is warm and dry.  Psychiatric: She has a normal mood and affect. Her behavior is normal.  Nursing note and vitals reviewed.   ED Course  Procedures   DIAGNOSTIC STUDIES: Oxygen Saturation is 100% on RA, normal by my interpretation.    COORDINATION OF CARE: 8:58 PM Discussed treatment plan with pt at bedside and pt agreed to plan.   Labs Review Labs Reviewed - No data to display  Imaging Review Dg  Knee Complete 4 Views Right  10/24/2015  CLINICAL DATA:  Right knee pain after falling today. Patient states she believes her right knee gave out, causing her to fall. Difficulty ambulating and bearing weight on Right knee. EXAM: RIGHT KNEE - COMPLETE 4+ VIEW COMPARISON:  None. FINDINGS: There is degenerative change involving the medial and patellofemoral compartments. No evidence for acute fracture or traumatic subluxation. IMPRESSION: 1. Degenerative  changes. 2.  No evidence for acute  abnormality. Electronically Signed   By: Nolon Nations M.D.   On: 10/24/2015 21:32   I have personally reviewed and evaluated these images and lab results as part of my medical decision-making.   EKG Interpretation None      MDM   Final diagnoses:  Right knee pain    Knee discomfort (dull achy feel) today progressively worsening after sitting in chair. Had mechanical fall this morning but no pain immediately afterwards. All pain has started a few hours after fall. No sharp component. Can ambulate with hesitation but no severe pain.   New Prescriptions: Discharge Medication List as of 10/24/2015 11:03 PM    START taking these medications   Details  HYDROcodone-acetaminophen (NORCO) 5-325 MG tablet Take 1-2 tablets by mouth every 4 (four) hours as needed., Starting 10/24/2015, Until Discontinued, Print        I have personally and contemperaneously reviewed labs and imaging and used in my decision making as above.   A medical screening exam was performed and I feel the patient has had an appropriate workup for their chief complaint at this time and likelihood of emergent condition existing is low. Their vital signs are stable. They have been counseled on decision, discharge, follow up and which symptoms necessitate immediate return to the emergency department.  They verbally stated understanding and agreement with plan and discharged in stable condition.   I personally performed the services described in this documentation, which was scribed in my presence. The recorded information has been reviewed and is accurate.    Merrily Pew, MD 10/25/15 [phone]

## 2015-10-25 MED ORDER — IBUPROFEN 800 MG PO TABS
800.0000 mg | ORAL_TABLET | Freq: Once | ORAL | Status: AC
  Administered 2015-10-25: 800 mg via ORAL
  Filled 2015-10-25: qty 1

## 2017-01-21 IMAGING — CR DG KNEE COMPLETE 4+V*R*
4 series · 4 of 4 positions shown · non-contrast
Comparison: None.

CLINICAL DATA: Right knee pain after falling today. Patient states
she believes her right knee gave out, causing her to fall.
Difficulty ambulating and bearing weight on Right knee.

EXAM:
RIGHT KNEE - COMPLETE 4+ VIEW

[t knee ap right]
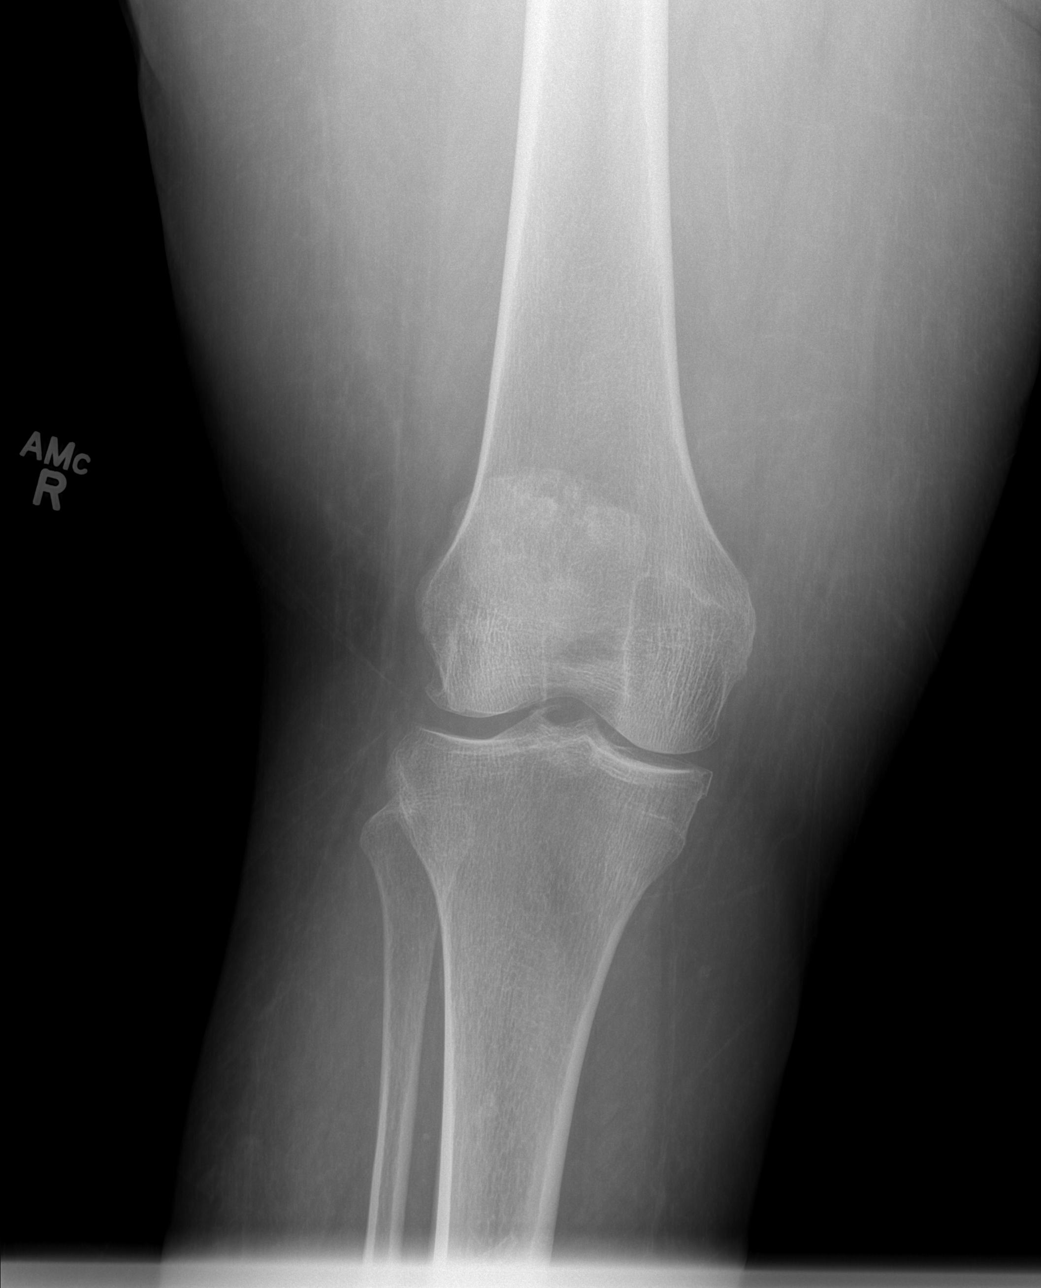

[t knee oblique right (1 of 2)]
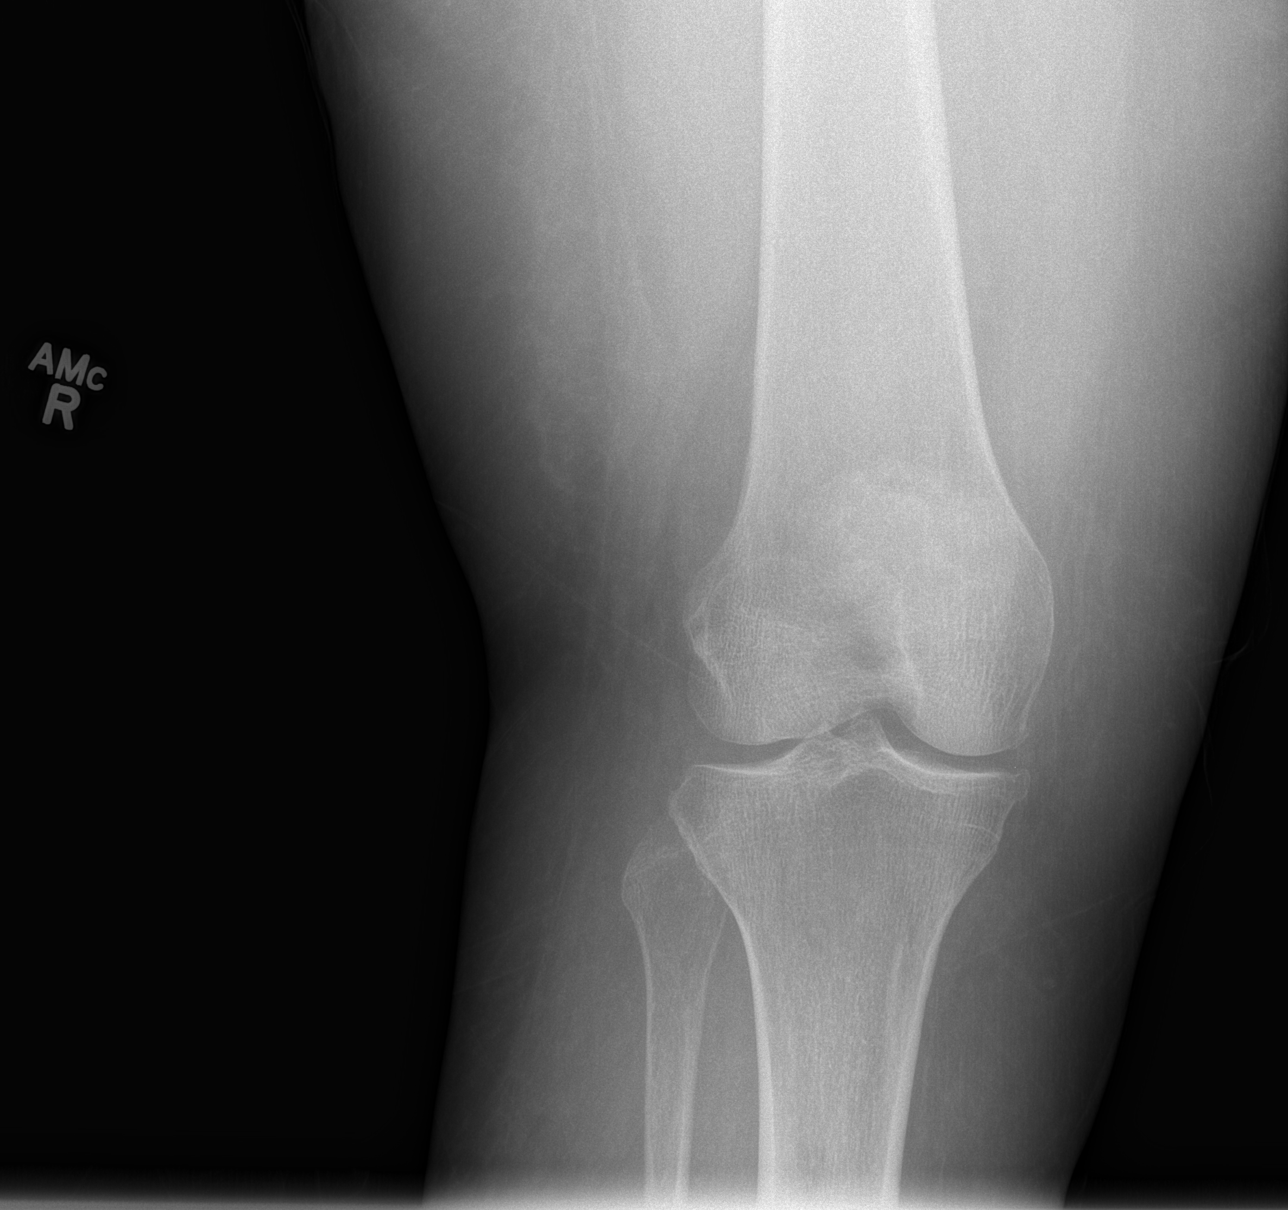

[t knee oblique right (2 of 2)]
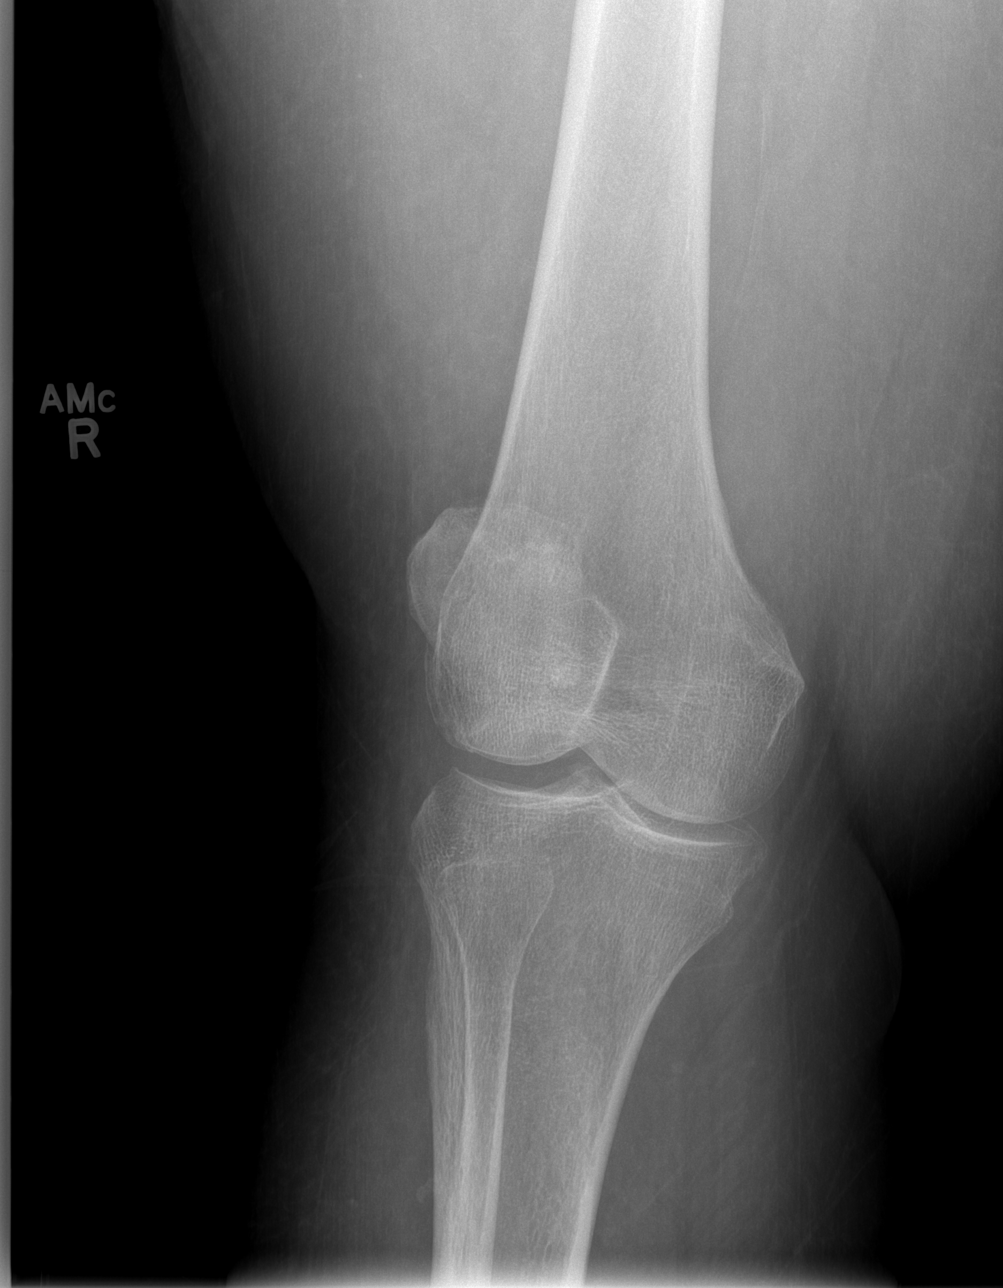

[t knee lat right]
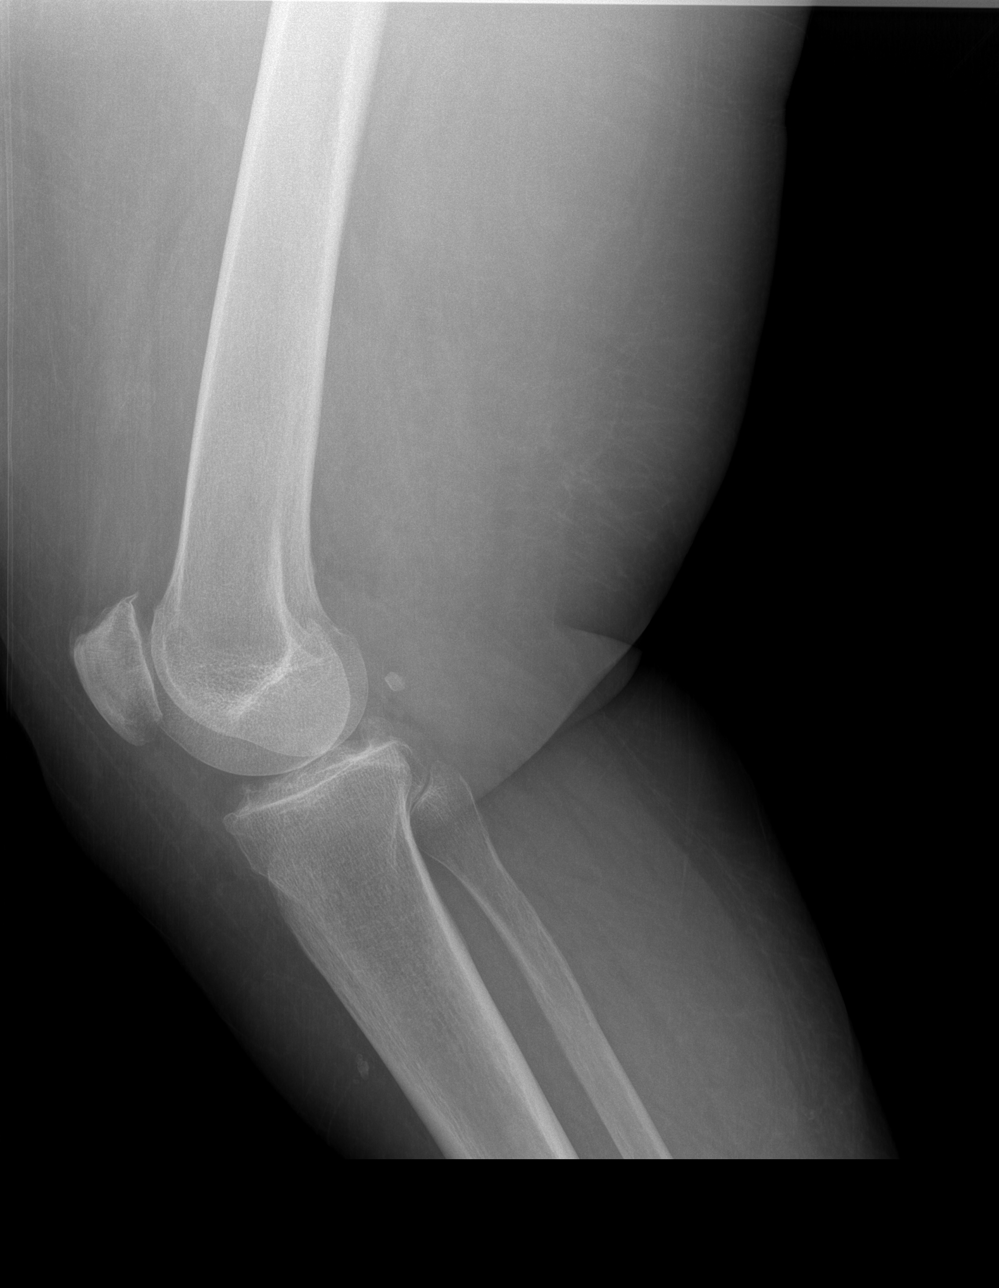

[4 of 4 positions shown; findings below may reference images not displayed]

FINDINGS: There is degenerative change involving the medial and patellofemoral
compartments. No evidence for acute fracture or traumatic
subluxation.
IMPRESSION: 1. Degenerative changes.
2.  No evidence for acute  abnormality.

## 2019-06-20 ENCOUNTER — Encounter: Payer: BLUE CROSS/BLUE SHIELD | Admitting: Obstetrics & Gynecology

## 2019-06-21 ENCOUNTER — Other Ambulatory Visit (HOSPITAL_COMMUNITY)
Admission: RE | Admit: 2019-06-21 | Discharge: 2019-06-21 | Disposition: A | Discharge status: Home / Self Care | Payer: Medicaid Other | Source: Ambulatory Visit | Attending: Obstetrics & Gynecology | Admitting: Obstetrics & Gynecology

## 2019-06-21 ENCOUNTER — Encounter: Payer: Self-pay | Admitting: Obstetrics & Gynecology

## 2019-06-21 ENCOUNTER — Ambulatory Visit (INDEPENDENT_AMBULATORY_CARE_PROVIDER_SITE_OTHER): Payer: Self-pay | Admitting: Obstetrics & Gynecology

## 2019-06-21 ENCOUNTER — Other Ambulatory Visit: Payer: Self-pay

## 2019-06-21 VITALS — BP 154/91 | HR 79 | Ht 65.5 in | Wt 324.1 lb

## 2019-06-21 DIAGNOSIS — Z01419 Encounter for gynecological examination (general) (routine) without abnormal findings: Secondary | ICD-10-CM | POA: Insufficient documentation

## 2019-06-21 DIAGNOSIS — D280 Benign neoplasm of vulva: Secondary | ICD-10-CM

## 2019-06-21 NOTE — Progress Notes (Signed)
Subjective:     Teresa Cruz is a 58 y.o. female here for a routine exam. LMP >10 years prev. No bleeding since that time. Pt last sexually active >5 years prev. She reports that at that time she 'had to pull up her labia to allow her partner to insert' She says that the swelling has become worse over the last few years and even worse over the past few months.  She was seen at one point by general surgery and was told that they could not do anything until she lost weight.     Gynecologic History No LMP recorded. Patient is postmenopausal. Contraception: post menopausal status Last Pap: >5 years prev. Results were: normal Last mammogram: >2 years prev. Results were: normal  Obstetric History OB History  Gravida Para Term Preterm AB Living  0 0 0 0 0 0  SAB TAB Ectopic Multiple Live Births  0 0 0 0 0   The following portions of the patient's history were reviewed and updated as appropriate: allergies, current medications, past family history, past medical history, past social history, past surgical history and problem list.  Review of Systems Pertinent items are noted in HPI.    Objective:  BP (!) 154/91   Pulse 79   Ht 5' 5.5" (1.664 m)   Wt (!) 324 lb 1.9 oz (147 kg)   BMI 53.12 kg/m      General Appearance:    Alert, cooperative, no distress, appears stated age  Head:    Normocephalic, without obvious abnormality, atraumatic  Eyes:    conjunctiva/corneas clear, EOM's intact, both eyes  Ears:    Normal external ear canals, both ears  Nose:   Nares normal, septum midline, mucosa normal, no drainage    or sinus tenderness  Throat:   Lips, mucosa, and tongue normal; teeth and gums normal  Neck:   Supple, symmetrical, trachea midline, no adenopathy;    thyroid:  no enlargement/tenderness/nodules  Back:     Symmetric, no curvature, ROM normal, no CVA tenderness  Lungs:     respirations unlabored  Chest Wall:    No tenderness or deformity   Heart:    Regular rate and rhythm   Breast Exam:    No tenderness, masses, or nipple abnormality  Abdomen:     Soft, non-tender, bowel sounds active all four quadrants,    no masses, no organomegaly  Genitalia:    The mons pubis is hypertrophied and is a markedly enlarged.   Poor hygiene noted.      Extremities:   Extremities normal, atraumatic, no cyanosis or edema  Pulses:   2+ and symmetric all extremities  Skin:   Skin color, texture, turgor normal, no rashes or lesions    Assessment:    Healthy female exam.   Hypertrophied mons pubis- I have discussed with pt that I would encourage weight loss as well. We reviewed web sites for chair exercises. This is not cancer it is related to obesity. Pt is aware. All questions answered.     Plan:   F/u PAP Refer for mammogram scholarship Referral to general surgery once the ins changes. Chair exercises Begin logging all food intake  Hoyle Sauer L. Harraway-Smith, M.D., Cherlynn June

## 2019-06-24 LAB — CYTOLOGY - PAP
Comment: NEGATIVE
Diagnosis: NEGATIVE
High risk HPV: NEGATIVE

## 2019-08-21 ENCOUNTER — Ambulatory Visit: Payer: Medicaid Other | Admitting: Obstetrics & Gynecology

## 2019-08-28 ENCOUNTER — Telehealth: Payer: Self-pay

## 2019-08-28 NOTE — Telephone Encounter (Signed)
Patient called stating that she was instructed to call us back once she got insurance. Patient states she now has insurance and would like to proceed with her surgery.  Upon reviewing the last note with Dr. Ihor Dow (06-21-2019) she states that she would need a referral to general surgery. Will send note to provider to intiate that referral. Kathrene Alu RN

## 2019-08-29 ENCOUNTER — Encounter: Payer: Self-pay | Admitting: Gastroenterology

## 2019-09-05 ENCOUNTER — Other Ambulatory Visit: Payer: Self-pay | Admitting: Obstetrics & Gynecology

## 2019-09-05 DIAGNOSIS — E65 Localized adiposity: Secondary | ICD-10-CM

## 2019-09-12 ENCOUNTER — Other Ambulatory Visit: Payer: Self-pay

## 2019-09-12 DIAGNOSIS — E65 Localized adiposity: Secondary | ICD-10-CM

## 2019-09-30 NOTE — Progress Notes (Deleted)
Referring Provider: Glendon Axe, MD Primary Care Physician:  Glendon Axe, MD  Reason for Consultation:  +FIT   IMPRESSION:  FIT positive BMI over 50  PLAN: Colonoscopy at the hospital  Please see the "Patient Instructions" section for addition details about the plan.  HPI: Teresa Cruz is a 59 y.o. female referred by Dr. Laurann Montana for further evaluation of a FIT positive.  The history is obtained through the patient and review of her electronic health record.  She has a history of hypertension, urinary incontinence, obesity, and osteoarthritis.  No symptoms associated with the + FIT.  No overt GI blood loss. No melena, hematochezia, bright red blood per rectum. No epistaxis, vaginal bleeding, hemoptysis, or hematuria.   Labs 08/21/2013 show a positive fecal globulin by immunochemistry  Uses ibuprofen as needed for her knee arthritis.  No known family history of colon cancer or polyps. No family history of uterine/endometrial cancer, pancreatic cancer or gastric/stomach cancer.   Past Medical History:  Diagnosis Date  . Hypertension     Past Surgical History:  Procedure Laterality Date  . MANDIBLE SURGERY    . OVARIAN CYST REMOVAL    . TONSILLECTOMY      Current Outpatient Medications  Medication Sig Dispense Refill  . acetaminophen (TYLENOL) 325 MG tablet Take 650 mg by mouth every 6 (six) hours as needed.    Marland Kitchen HYDROcodone-acetaminophen (NORCO) 5-325 MG tablet Take 1-2 tablets by mouth every 4 (four) hours as needed. (Patient not taking: Reported on 06/21/2019) 10 tablet 0  . ibuprofen (ADVIL) 200 MG tablet Take 200 mg by mouth every 6 (six) hours as needed.    Marland Kitchen lisinopril (PRINIVIL,ZESTRIL) 10 MG tablet Take 1 tablet (10 mg total) by mouth daily. (Patient not taking: Reported on 06/21/2019) 30 tablet 0   No current facility-administered medications for this visit.    Allergies as of 10/01/2019  . (No Known Allergies)    No family history on file.  Social  History   Socioeconomic History  . Marital status: Single    Spouse name: Not on file  . Number of children: Not on file  . Years of education: Not on file  . Highest education level: Not on file  Occupational History  . Not on file  Tobacco Use  . Smoking status: Never Smoker  . Smokeless tobacco: Never Used  Substance and Sexual Activity  . Alcohol use: No  . Drug use: No  . Sexual activity: Not Currently    Birth control/protection: None  Other Topics Concern  . Not on file  Social History Narrative  . Not on file   Social Determinants of Health   Financial Resource Strain:   . Difficulty of Paying Living Expenses: Not on file  Food Insecurity:   . Worried About Charity fundraiser in the Last Year: Not on file  . Ran Out of Food in the Last Year: Not on file  Transportation Needs:   . Lack of Transportation (Medical): Not on file  . Lack of Transportation (Non-Medical): Not on file  Physical Activity:   . Days of Exercise per Week: Not on file  . Minutes of Exercise per Session: Not on file  Stress:   . Feeling of Stress : Not on file  Social Connections:   . Frequency of Communication with Friends and Family: Not on file  . Frequency of Social Gatherings with Friends and Family: Not on file  . Attends Religious Services: Not on file  .  Active Member of Clubs or Organizations: Not on file  . Attends Archivist Meetings: Not on file  . Marital Status: Not on file  Intimate Partner Violence:   . Fear of Current or Ex-Partner: Not on file  . Emotionally Abused: Not on file  . Physically Abused: Not on file  . Sexually Abused: Not on file    Review of Systems: 12 system ROS is negative except as noted above.   Physical Exam: General:   Alert,  well-nourished, pleasant and cooperative in NAD Head:  Normocephalic and atraumatic. Eyes:  Sclera clear, no icterus.   Conjunctiva pink. Ears:  Normal auditory acuity. Nose:  No deformity, discharge,  or  lesions. Mouth:  No deformity or lesions.   Neck:  Supple; no masses or thyromegaly. Lungs:  Clear throughout to auscultation.   No wheezes. Heart:  Regular rate and rhythm; no murmurs. Abdomen:  Soft,nontender, nondistended, normal bowel sounds, no rebound or guarding. No hepatosplenomegaly.   Rectal:  Deferred  Msk:  Symmetrical. No boney deformities LAD: No inguinal or umbilical LAD Extremities:  No clubbing or edema. Neurologic:  Alert and  oriented x4;  grossly nonfocal Skin:  Intact without significant lesions or rashes. Psych:  Alert and cooperative. Normal mood and affect.    Teresa Schleich L. Tarri Glenn, MD, MPH 09/30/2019, 3:19 PM

## 2019-10-01 ENCOUNTER — Ambulatory Visit: Payer: Medicare HMO | Admitting: Gastroenterology

## 2019-11-07 ENCOUNTER — Ambulatory Visit: Payer: Medicaid Other | Admitting: Gastroenterology

## 2019-11-19 ENCOUNTER — Other Ambulatory Visit: Payer: Self-pay

## 2019-11-19 ENCOUNTER — Emergency Department (HOSPITAL_BASED_OUTPATIENT_CLINIC_OR_DEPARTMENT_OTHER): Payer: Medicare HMO

## 2019-11-19 ENCOUNTER — Emergency Department (HOSPITAL_BASED_OUTPATIENT_CLINIC_OR_DEPARTMENT_OTHER)
Admission: EM | Admit: 2019-11-19 | Discharge: 2019-11-19 | Disposition: A | Discharge status: Home / Self Care | Payer: Medicare HMO | Attending: Emergency Medicine | Admitting: Emergency Medicine

## 2019-11-19 ENCOUNTER — Encounter (HOSPITAL_BASED_OUTPATIENT_CLINIC_OR_DEPARTMENT_OTHER): Payer: Self-pay | Admitting: *Deleted

## 2019-11-19 DIAGNOSIS — Y998 Other external cause status: Secondary | ICD-10-CM | POA: Insufficient documentation

## 2019-11-19 DIAGNOSIS — W19XXXA Unspecified fall, initial encounter: Secondary | ICD-10-CM | POA: Diagnosis not present

## 2019-11-19 DIAGNOSIS — G8929 Other chronic pain: Secondary | ICD-10-CM | POA: Diagnosis not present

## 2019-11-19 DIAGNOSIS — M25511 Pain in right shoulder: Secondary | ICD-10-CM | POA: Diagnosis not present

## 2019-11-19 DIAGNOSIS — I10 Essential (primary) hypertension: Secondary | ICD-10-CM | POA: Insufficient documentation

## 2019-11-19 DIAGNOSIS — Y92832 Beach as the place of occurrence of the external cause: Secondary | ICD-10-CM | POA: Insufficient documentation

## 2019-11-19 DIAGNOSIS — Y9301 Activity, walking, marching and hiking: Secondary | ICD-10-CM | POA: Insufficient documentation

## 2019-11-19 NOTE — ED Notes (Signed)
ED Provider at bedside. 

## 2019-11-19 NOTE — ED Provider Notes (Signed)
[name] Note   CSN: [id number] Arrival date & time: 11/19/19  1755     History Chief Complaint  Patient presents with  . Shoulder Pain    Teresa Cruz is a 59 y.o. female with PMH significant for right-sided rotator cuff tendinitis and adhesive capsulitis followed by Dr. Sherlie Ban with [facility] who presents the ED with right shoulder pain. She was referred to physical therapy for ongoing evaluation management.  She states that she has been going to her physical therapy appointments, as scheduled.  [place] sustained a mechanical fall while ambulating.  Patient reports that she uses a cane given her bilateral knee osteoarthritis and that her cane slipped out from underneath her on the tile causing her to fall onto her right side.  She complains of worsening right shoulder discomfort.  She takes Tylenol and Advil for her symptoms of pain.  She does not like taking narcotic medication for her symptoms of discomfort.  She is scheduled to go back to her physical therapy tomorrow and will discuss possibility of using an assistive walker device.  She denies any new deficits, numbness or weakness, cold extremity, significant swelling, other injury, or other symptoms.  HPI     Past Medical History:  Diagnosis Date  . Hypertension     There are no problems to display for this patient.   Past Surgical History:  Procedure Laterality Date  . MANDIBLE SURGERY    . OVARIAN CYST REMOVAL    . TONSILLECTOMY       OB History    Gravida  0   Para  0   Term  0   Preterm  0   AB  0   Living  0     SAB  0   TAB  0   Ectopic  0   Multiple  0   Live Births  0           No family history on file.  Social History   Tobacco Use  . Smoking status: Never Smoker  . Smokeless tobacco: Never Used  Substance Use Topics  . Alcohol use: No  . Drug use: No    Home Medications Prior to  Admission medications   Medication Sig Start Date End Date Taking? Authorizing Provider  acetaminophen (TYLENOL) 325 MG tablet Take 650 mg by mouth every 6 (six) hours as needed.    [provider]  ibuprofen (ADVIL) 200 MG tablet Take 200 mg by mouth every 6 (six) hours as needed.    [provider]  lisinopril (ZESTRIL) 20 MG tablet Take 20 mg by mouth daily. 08/26/19   [provider]  oxybutynin (DITROPAN-XL) 5 MG 24 hr tablet Take 5 mg by mouth at bedtime. 08/26/19   [provider]    Allergies    Patient has no known allergies.  Review of Systems   Review of Systems  Constitutional: Negative for fever.  Musculoskeletal: Positive for arthralgias. Negative for joint swelling.  Skin: Negative for color change.  Neurological: Negative for weakness and numbness.  Hematological: Does not bruise/bleed easily.    Physical Exam Updated Vital Signs BP (!) 118/101 (BP Location: Right Wrist)   Pulse 83   Temp 99.2 F (37.3 C) (Oral)   Resp 16   Ht 5' 5.5" (1.664 m)   Wt (!) 147 kg   SpO2 100%   BMI 53.11 kg/m   Physical Exam  Vitals and nursing note reviewed. Exam conducted with a chaperone present.  Constitutional:      Appearance: Normal appearance.  HENT:     Head: Normocephalic and atraumatic.  Eyes:     General: No scleral icterus.    Conjunctiva/sclera: Conjunctivae normal.  Cardiovascular:     Rate and Rhythm: Normal rate and regular rhythm.     Pulses: Normal pulses.     Heart sounds: Normal heart sounds.  Pulmonary:     Effort: Pulmonary effort is normal. No respiratory distress.     Breath sounds: Normal breath sounds.  Musculoskeletal:     Comments: Right shoulder: No significant tenderness to palpation.  Overlying keloid scar, but no redness, warmth, or evidence to suggest infection.  No significant swelling when compared to left shoulder.  Passive ROM intact, but unable to abduct right arm actively (chronic).  No humeral or  clavicular TTP. Right elbow: Normal.  ROM and strength intact. Right wrist: Normal.  ROM and strength intact.  Radial pulse intact.  Distal sensation intact throughout.  Cap refill intact.  Skin:    General: Skin is dry.     Capillary Refill: Capillary refill takes less than 2 seconds.  Neurological:     Mental Status: She is alert and oriented to person, place, and time.     GCS: GCS eye subscore is 4. GCS verbal subscore is 5. GCS motor subscore is 6.  Psychiatric:        Mood and Affect: Mood normal.        Behavior: Behavior normal.        Thought Content: Thought content normal.     ED Results / Procedures / Treatments   Labs (all labs ordered are listed, but only abnormal results are displayed) Labs Reviewed - No data to display  EKG None  Radiology DG Shoulder Right  Result Date: 11/19/2019 CLINICAL DATA:  Fall.  Right shoulder pain EXAM: RIGHT SHOULDER - 2+ VIEW COMPARISON:  None. FINDINGS: Negative for fracture or dislocation. Moderate degenerative change in the [facility] joint. Degenerative changes in the greater tuberosity. Mild degenerative change in the shoulder. IMPRESSION: Negative for fracture. Electronically Signed   By: Franchot Gallo M.D.   On: 11/19/2019 20:05    Procedures Procedures (including critical care time)  Medications Ordered in ED Medications - No data to display  ED Course  I have reviewed the triage vital signs and the nursing notes.  Pertinent labs & imaging results that were available during my care of the patient were reviewed by me and considered in my medical decision making (see chart for details).    MDM Rules/Calculators/A&P                      I personally reviewed plain films obtained of right shoulder which demonstrates no acute fracture, dislocation, effusion, or other acute osseous abnormalities / arthropathies.  Her fall was mechanical in nature and does not require any further work-up.  She denies any head injury or LOC.  Her  physical exam was entirely reassuring.  While she cannot abduct or raise arm actively, this is chronic with her baseline.  No significant tenderness to palpation.  No swelling or other overlying skin changes.  She is able to flex and extend elbow and wrist against resistance.  Compartments are soft.  Cap refill, radial pulse, and sensation intact.  Plan is for patient to continue Tylenol and ibuprofen as needed for her symptoms of discomfort.  Given  her ambulatory dysfunction using cane, encouraging her to discuss assistive walker while at physical therapy tomorrow.  I also recommended that she apply topical IcyHot or similar topical cream for additional pain control.  Strict ED return precautions discussed.  All of the evaluation and work-up results were discussed with the patient and any family at bedside. They were provided opportunity to ask any additional questions and have none at this time. They have expressed understanding of verbal discharge instructions as well as return precautions and are agreeable to the plan.    Final Clinical Impression(s) / ED Diagnoses Final diagnoses:  Chronic right shoulder pain  Fall, initial encounter    Rx / DC Orders ED Discharge Orders    None       Reita Chard 11/19/19 2143    Lucrezia Starch, MD 11/20/19 [phone]

## 2019-11-19 NOTE — ED Notes (Signed)
Patient transported to X-ray 

## 2019-11-19 NOTE — ED Triage Notes (Signed)
pt c/o chronic right shoulder pain , fall x 1 day ago

## 2019-11-19 NOTE — Discharge Instructions (Addendum)
Please continue take Tylenol and ibuprofen as needed for your symptoms of discomfort.  I recommend that you apply topical analgesics such as icy hot or similar over-the-counter pain control.  Please go to your physical therapy appointment tomorrow, as scheduled.  I would like for you to discuss with them possibility of an assistive walker given your ambulatory dysfunction.  I do not want you to have any additional falls.  Please return to the ED or seek immediate medical attention should you experience any new or worsening symptoms.

## 2021-12-13 ENCOUNTER — Emergency Department (HOSPITAL_BASED_OUTPATIENT_CLINIC_OR_DEPARTMENT_OTHER)
Admission: EM | Admit: 2021-12-13 | Discharge: 2021-12-13 | Disposition: A | Discharge status: Home / Self Care | Payer: 59 | Attending: Emergency Medicine | Admitting: Emergency Medicine

## 2021-12-13 ENCOUNTER — Emergency Department (HOSPITAL_BASED_OUTPATIENT_CLINIC_OR_DEPARTMENT_OTHER): Payer: 59

## 2021-12-13 ENCOUNTER — Encounter (HOSPITAL_BASED_OUTPATIENT_CLINIC_OR_DEPARTMENT_OTHER): Payer: Self-pay | Admitting: Urology

## 2021-12-13 DIAGNOSIS — M79605 Pain in left leg: Secondary | ICD-10-CM | POA: Diagnosis present

## 2021-12-13 DIAGNOSIS — L03116 Cellulitis of left lower limb: Secondary | ICD-10-CM | POA: Diagnosis not present

## 2021-12-13 DIAGNOSIS — I10 Essential (primary) hypertension: Secondary | ICD-10-CM | POA: Insufficient documentation

## 2021-12-13 DIAGNOSIS — Z79899 Other long term (current) drug therapy: Secondary | ICD-10-CM | POA: Diagnosis not present

## 2021-12-13 MED ORDER — CEPHALEXIN 500 MG PO CAPS: 500.0000 mg | ORAL_CAPSULE | Freq: Three times a day (TID) | ORAL | 0 refills | Status: AC

## 2021-12-13 NOTE — ED Notes (Signed)
Pt NAD, a/ox4. Pt verbalizes understanding of all DC and f/u instructions. All questions answered. Pt assisted into w/c and taken to lobby with brother.  ? ?

## 2021-12-13 NOTE — ED Provider Notes (Signed)
?[facility] ?Provider Note ? ? ?CSN: [id number] ?Arrival date & time: 12/13/21  1749 ? ?  ? ?History ? ?Chief Complaint  ?Patient presents with  ? Leg Pain  ? ? ?Teresa Cruz is a 61 y.o. female with history of hypertension, HS that presents primarily in her groin and morbid obesity who presents to the ED from urgent care for evaluation of left lower leg extremity swelling and redness that has developed over the last 24 hours.  She denies any injury.  Patient states that she was at physical therapy where she goes to work on her mobility when the PT noticed the limb and suggested she get that evaluated.  At urgent care, there was concern for DVT so she was referred here to the ED.  She denies numbness and tingling, fevers, chills.  She is ambulatory at home with a walker. ? ?No prior history of DVT. ? ? ?Leg Pain ? ?  ? ?Home Medications ?Prior to Admission medications   ?Medication Sig Start Date End Date Taking? Authorizing Provider  ?amLODipine (NORVASC) 5 MG tablet Take by mouth. 01/22/14  Yes [provider]  ?cephALEXin (KEFLEX) 500 MG capsule Take 1 capsule (500 mg total) by mouth 3 (three) times daily for 7 days. 12/13/21 12/20/21 Yes Kathe Becton R, PA-C  ?losartan (COZAAR) 100 MG tablet Take 1 tablet by mouth daily. 04/13/21  Yes [provider]  ?oxybutynin (DITROPAN-XL) 10 MG 24 hr tablet Take by mouth. 10/13/21  Yes [provider]  ?sertraline (ZOLOFT) 25 MG tablet Take 1 tablet by mouth daily. 12/01/21  Yes [provider]  ?spironolactone (ALDACTONE) 50 MG tablet Take 1 tablet daily after breakfast 11/11/21  Yes [provider]  ?tirzepatide Darcel Bayley) 2.5 MG/0.5ML Pen  12/01/21  Yes [provider]  ?acetaminophen (TYLENOL) 325 MG tablet Take 650 mg by mouth every 6 (six) hours as needed.    [provider]  ?ibuprofen (ADVIL) 200 MG tablet Take 200 mg by mouth every 6 (six) hours as needed.    [provider]  ?lisinopril (ZESTRIL) 20 MG tablet Take 20 mg by mouth daily. 08/26/19   [provider]  ?oxybutynin (DITROPAN-XL) 5 MG 24 hr tablet Take 5 mg by mouth at bedtime. 08/26/19   [provider]  ?   ? ?Allergies    ?Patient has no known allergies.   ? ?Review of Systems   ?Review of Systems ? ?Physical Exam ?Updated Vital Signs ?BP 128/86   Pulse 67   Temp 98.1 ?F (36.7 ?C) (Oral)   Resp 18   Ht '5\' 5"'$  (1.651 m)   Wt (!) 145.2 kg   SpO2 100%   BMI 53.25 kg/m?  ?Physical Exam ?Vitals and nursing note reviewed.  ?Constitutional:   ?   General: She is not in acute distress. ?   Appearance: She is not ill-appearing.  ?HENT:  ?   Head: Atraumatic.  ?Eyes:  ?   Conjunctiva/sclera: Conjunctivae normal.  ?Cardiovascular:  ?   Rate and Rhythm: Normal rate and regular rhythm.  ?   Pulses: Normal pulses.  ?   Heart sounds: No murmur heard. ?Pulmonary:  ?   Effort: Pulmonary effort is normal. No respiratory distress.  ?   Breath sounds: Normal breath sounds.  ?Abdominal:  ?   General: Abdomen is flat. There is no distension.  ?   Palpations: Abdomen is soft.  ?   Tenderness: There is no abdominal tenderness.  ?Musculoskeletal:     ?  General: Normal range of motion.  ?   Cervical back: Normal range of motion.  ?   Comments: Bilateral lower extremity edema, notably worse on the left compared to the right.  Circumferential erythema with diffuse tenderness to palpation of the entire left lower extremity.  Distal sensation intact.  DP pulses 2+  ?Skin: ?   General: Skin is warm and dry.  ?   Capillary Refill: Capillary refill takes less than 2 seconds.  ?Neurological:  ?   General: No focal deficit present.  ?   Mental Status: She is alert.  ?Psychiatric:     ?   Mood and Affect: Mood normal.  ? ? ?ED Results / Procedures / Treatments   ?Labs ?(all labs ordered are listed, but only abnormal results are displayed) ?Labs Reviewed - No data to display ? ?EKG ?None ? ?Radiology ?US Venous Img Lower Unilateral  Left ? ?Result Date: 12/13/2021 ?CLINICAL DATA:  Left calf pain and redness, onset today. EXAM: LEFT LOWER EXTREMITY VENOUS DOPPLER ULTRASOUND TECHNIQUE: Gray-scale sonography with compression, as well as color and duplex ultrasound, were performed to evaluate the deep venous system(s) from the level of the common femoral vein through the popliteal and proximal calf veins. COMPARISON:  None Available. FINDINGS: VENOUS Normal compressibility of the common femoral, superficial femoral, and popliteal veins. Visualized portions of profunda femoral vein and great saphenous vein unremarkable. No filling defects to suggest DVT on grayscale or color Doppler imaging. Doppler waveforms show normal direction of venous flow, normal respiratory plasticity and response to augmentation. The calf veins are not visualized due to soft tissue edema and habitus. Limited views of the contralateral common femoral vein are unremarkable. OTHER Subcutaneous edema is noted in the left calf. Limitations: Patient body habitus IMPRESSION: No evidence of left lower extremity DVT. Electronically Signed   By: Keith Rake M.D.   On: 12/13/2021 19:39   ? ?Procedures ?Procedures  ? ? ?Medications Ordered in ED ?Medications - No data to display ? ?ED Course/ Medical Decision Making/ A&P ?  ?                        ?Medical Decision Making ?Risk ?Prescription drug management. ? ? ?Social determinants of health:  ?Social History  ? ?Socioeconomic History  ? Marital status: Single  ?  Spouse name: Not on file  ? Number of children: Not on file  ? Years of education: Not on file  ? Highest education level: Not on file  ?Occupational History  ? Not on file  ?Tobacco Use  ? Smoking status: Never  ? Smokeless tobacco: Never  ?Substance and Sexual Activity  ? Alcohol use: No  ? Drug use: No  ? Sexual activity: Not Currently  ?  Birth control/protection: None  ?Other Topics Concern  ? Not on file  ?Social History Narrative  ? Not on file  ? ?Social  Determinants of Health  ? ?Financial Resource Strain: Not on file  ?Food Insecurity: Not on file  ?Transportation Needs: Not on file  ?Physical Activity: Not on file  ?Stress: Not on file  ?Social Connections: Not on file  ?Intimate Partner Violence: Not on file  ? ? ? ?Initial impression: ? ?This patient presents to the ED for concern of left lower extremity swelling and erythema, this involves an extensive number of treatment options, and is a complaint that carries with it a high risk of complications and morbidity.   Differentials include DVT, cellulitis,  gout, arthritis, necrotizing fasciitis.  ? ?Comorbidities affecting care:  ?Hypertension, obesity ? ?Additional history obtained: ?Urgent care records prior to visit ? ? ?Imaging Studies ordered: ? ?I ordered imaging studies including  ?Venous ultrasound of the lower extremity without evidence of DVT or Baker's cyst ?I independently visualized and interpreted imaging and I agree with the radiologist interpretation.  ? ? ?ED Course/Re-evaluation: ?61 year old female in no acute distress, nontoxic-appearing presents to the ED from urgent care for evaluation of left lower extremity redness and swelling.  Vitals are without significant abnormalities.  On exam, she has circumferential erythema and tenderness of the left lower extremity from the ankle up to the knee joint.  She does have posterior calf tenderness, however she has tenderness throughout the entire circumferential calf as well.  I ordered and interpreted venous ultrasound of the left lower extremity without evidence of DVT or Baker's cyst.  Based on her clinical presentation and negative imaging, patient's symptoms are more consistent with a cellulitis.  Distribution not consistent with gout, arthritis.  Clinical exam not consistent with necrotizing fasciitis.  Patient discharged home with Keflex 3 times daily for 1 week.  She is to follow-up with her PCP for reevaluation.  Patient expresses  understanding and is amenable to plan. ? ?Disposition: ? ?After consideration of the diagnostic results, physical exam, history and the patients response to treatment feel that the patent would benefit from discharge.   ?Cellu

## 2021-12-13 NOTE — ED Triage Notes (Signed)
UC sent to R/o DVT in left leg  ?Report left calf pain that started this am  ?No h/o blood clots  ?Redness noted to leg ?

## 2021-12-13 NOTE — Discharge Instructions (Addendum)
It appears that the redness and swelling of your left leg is from a skin infection.  I sent you in a medication called Keflex he will take this 3 times a day for 1 week.  You can use Tylenol or Motrin as needed for pain and discomfort.  If your symptoms continue to worsen or you develop fevers, either follow-up with your PCP or return to the ED for evaluation. ?

## 2021-12-13 NOTE — ED Notes (Addendum)
Pt return from [place] at this time. A/px4, c/o left calf pain since this morning with redness. -SOB ?

## 2022-11-26 ENCOUNTER — Encounter (HOSPITAL_BASED_OUTPATIENT_CLINIC_OR_DEPARTMENT_OTHER): Payer: Self-pay | Admitting: Emergency Medicine

## 2022-11-26 ENCOUNTER — Emergency Department (HOSPITAL_BASED_OUTPATIENT_CLINIC_OR_DEPARTMENT_OTHER): Payer: 59

## 2022-11-26 ENCOUNTER — Emergency Department (HOSPITAL_BASED_OUTPATIENT_CLINIC_OR_DEPARTMENT_OTHER)
Admission: EM | Admit: 2022-11-26 | Discharge: 2022-11-26 | Disposition: A | Discharge status: Home / Self Care | Payer: 59 | Attending: Emergency Medicine | Admitting: Emergency Medicine

## 2022-11-26 DIAGNOSIS — W19XXXA Unspecified fall, initial encounter: Secondary | ICD-10-CM

## 2022-11-26 DIAGNOSIS — M25571 Pain in right ankle and joints of right foot: Secondary | ICD-10-CM | POA: Diagnosis present

## 2022-11-26 HISTORY — DX: Repeated falls: R29.6

## 2022-11-26 NOTE — Discharge Instructions (Signed)
You were seen in the emergency department following a fall. You had imaging performed which revealed a potential avulsion fracture of the right dorsal talus, but this is difficult to determine based on the image and no obvious point tenderness in the area on the exam. I would advise you follow up with Dr. Earma Reading who previously evaluated you and reported good experiences with them. If you feel that your symptoms are worsening, please return to the ED for further evaluation.

## 2022-11-26 NOTE — ED Triage Notes (Signed)
Pt to ED via [facility]; sts she fell while trying to get out of car today; c/o RT foot and RLE (knee down) pain

## 2022-11-26 NOTE — ED Provider Notes (Signed)
[facility] AT [facility] Provider Note   CSN: [id number] Arrival date & time: 11/26/22  1720     History Chief Complaint  Patient presents with   Fall    Teresa Cruz is a 62 y.o. female. Patient presents to the ED for a fall. She reports that she fell as she was getting out of her vehicle earlier today. Unsure exactly how she fell but feels that her ankle rolled under her leg. Has difficulty bearing weight on ambulation due to weakness in left leg, but states that this is chronic in nature. Denies any weakness in right leg, no saddle paraesthesia, or bowel or bladder incontinence.   Fall       Home Medications Prior to Admission medications   Medication Sig Start Date End Date Taking? Authorizing Provider  acetaminophen (TYLENOL) 325 MG tablet Take 650 mg by mouth every 6 (six) hours as needed.    [provider]  amLODipine (NORVASC) 5 MG tablet Take by mouth. 01/22/14   [provider]  ibuprofen (ADVIL) 200 MG tablet Take 200 mg by mouth every 6 (six) hours as needed.    [provider]  lisinopril (ZESTRIL) 20 MG tablet Take 20 mg by mouth daily. 08/26/19   [provider]  losartan (COZAAR) 100 MG tablet Take 1 tablet by mouth daily. 04/13/21   [provider]  oxybutynin (DITROPAN-XL) 10 MG 24 hr tablet Take by mouth. 10/13/21   [provider]  oxybutynin (DITROPAN-XL) 5 MG 24 hr tablet Take 5 mg by mouth at bedtime. 08/26/19   [provider]  sertraline (ZOLOFT) 25 MG tablet Take 1 tablet by mouth daily. 12/01/21   [provider]  spironolactone (ALDACTONE) 50 MG tablet Take 1 tablet daily after breakfast 11/11/21   [provider]  tirzepatide Greggory Keen) 2.5 MG/0.5ML Pen  12/01/21   [provider]      Allergies    Patient has no known allergies.    Review of Systems   Review of Systems  Musculoskeletal:  Positive for joint swelling.  All other  systems reviewed and are negative.   Physical Exam Updated Vital Signs BP 124/78   Pulse 84   Temp 98.2 F (36.8 C) (Oral)   Resp 20   Ht  (1.676 m)   Wt 134.7 kg   SpO2 99%   BMI 47.94 kg/m  Physical Exam Vitals and nursing note reviewed.  Constitutional:      General: She is not in acute distress.    Appearance: She is well-developed.  HENT:     Head: Normocephalic and atraumatic.  Eyes:     Conjunctiva/sclera: Conjunctivae normal.  Cardiovascular:     Rate and Rhythm: Normal rate and regular rhythm.     Heart sounds: No murmur heard. Pulmonary:     Effort: Pulmonary effort is normal. No respiratory distress.     Breath sounds: Normal breath sounds.  Abdominal:     Palpations: Abdomen is soft.     Tenderness: There is no abdominal tenderness.  Musculoskeletal:        General: Swelling and tenderness present. No deformity or signs of injury. Normal range of motion.     Cervical back: Neck supple.     Comments: Very minimal swelling noted to right ankle and tenderness to palpation of dorsal aspect of ankle. However, she feels that most pain is inferior to right knee. Able to bear weight but concerned about left leg giving  out limiting mobility  Skin:    General: Skin is warm and dry.     Capillary Refill: Capillary refill takes less than 2 seconds.  Neurological:     Mental Status: She is alert.  Psychiatric:        Mood and Affect: Mood normal.     ED Results / Procedures / Treatments   Labs (all labs ordered are listed, but only abnormal results are displayed) Labs Reviewed - No data to display  EKG None  Radiology DG Tibia/Fibula Right  Result Date: 11/26/2022 CLINICAL DATA:  Pain after fall EXAM: RIGHT TIBIA AND FIBULA - 2 VIEW; RIGHT FOOT COMPLETE - 3 VIEW; RIGHT ANKLE - COMPLETE 3 VIEW COMPARISON:  None Available. FINDINGS: Osteopenia. Soft tissue swelling about the ankle. Along the dorsal aspect of the foot at the level of the distal talus has a  small density. Small avulsion injury is not excluded. Please correlate to point tenderness. Otherwise no fracture or dislocation. Well corticated plantar greater than Achilles calcaneal spurs. Scattered vascular calcifications. Nonspecific soft tissue calcifications seen about the lower leg. IMPRESSION: Tiny density seen about the dorsal aspect of the distal talus on the lateral view of the ankle and foot with soft tissue swelling. Please correlate for an avulsion fracture. Calcaneal spurs. Osteopenia. Scattered vascular calcifications Electronically Signed   By: Karen Kays M.D.   On: 11/26/2022 18:21   DG Ankle Complete Right  Result Date: 11/26/2022 CLINICAL DATA:  Pain after fall EXAM: RIGHT TIBIA AND FIBULA - 2 VIEW; RIGHT FOOT COMPLETE - 3 VIEW; RIGHT ANKLE - COMPLETE 3 VIEW COMPARISON:  None Available. FINDINGS: Osteopenia. Soft tissue swelling about the ankle. Along the dorsal aspect of the foot at the level of the distal talus has a small density. Small avulsion injury is not excluded. Please correlate to point tenderness. Otherwise no fracture or dislocation. Well corticated plantar greater than Achilles calcaneal spurs. Scattered vascular calcifications. Nonspecific soft tissue calcifications seen about the lower leg. IMPRESSION: Tiny density seen about the dorsal aspect of the distal talus on the lateral view of the ankle and foot with soft tissue swelling. Please correlate for an avulsion fracture. Calcaneal spurs. Osteopenia. Scattered vascular calcifications Electronically Signed   By: Karen Kays M.D.   On: 11/26/2022 18:21   DG Foot Complete Right  Result Date: 11/26/2022 CLINICAL DATA:  Pain after fall EXAM: RIGHT TIBIA AND FIBULA - 2 VIEW; RIGHT FOOT COMPLETE - 3 VIEW; RIGHT ANKLE - COMPLETE 3 VIEW COMPARISON:  None Available. FINDINGS: Osteopenia. Soft tissue swelling about the ankle. Along the dorsal aspect of the foot at the level of the distal talus has a small density. Small  avulsion injury is not excluded. Please correlate to point tenderness. Otherwise no fracture or dislocation. Well corticated plantar greater than Achilles calcaneal spurs. Scattered vascular calcifications. Nonspecific soft tissue calcifications seen about the lower leg. IMPRESSION: Tiny density seen about the dorsal aspect of the distal talus on the lateral view of the ankle and foot with soft tissue swelling. Please correlate for an avulsion fracture. Calcaneal spurs. Osteopenia. Scattered vascular calcifications Electronically Signed   By: Karen Kays M.D.   On: 11/26/2022 18:21    Procedures Procedures   Medications Ordered in ED Medications - No data to display  ED Course/ Medical Decision Making/ A&P                           Medical Decision Making Amount  and/or Complexity of Data Reviewed Radiology: ordered.   This patient presents to the ED for concern of fall.  Differential diagnosis includes concussion, syncope, ankle sprain, ankle fracture   Imaging Studies ordered:  I ordered imaging studies including x-ray of right ankle, right tibia-fibula, right foot I independently visualized and interpreted imaging which showed possible dorsal avulsion fracture of the talus I agree with the radiologist interpretation   Problem List / ED Course:  Patient presents emergency department complaints of a fall.  She reports that she is having right leg pain since this fall.  Reports majority pain is inferior to the right knee and was having difficulty bearing weight because of left leg weakness.  Denies any injury to the left leg however she reports that she has been having some chronic pain and weakness in this leg.  X-ray imaging was ordered to evaluate possible injuries and there does appear to be a potential avulsion fracture noted to the dorsal aspect of the talus of the right ankle/foot.  Informed patient of findings and physical exam did not reveal obvious point tenderness in this area  so I doubt finding of an avulsion fraction however, I did recommend the patient follow-up with her orthopedic surgeon that she has previously seen Dr.Bashore.  Patient in agreement this treatment plan and verbalized understanding all return precautions.  Patient was placed into a cam walking boot to provide additional support in this area to reduce any discomfort.  Did also advise patient to take over-the-counter pain medication such as Tylenol ibuprofen for further symptomatic treatment.  Encourage patient to return if she feels that her symptoms are worsening or begins to experience weakness and numbness in the right leg.  All questions answered prior to patient discharge.  Final Clinical Impression(s) / ED Diagnoses Final diagnoses:  Fall, initial encounter  Acute right ankle pain    Rx / DC Orders ED Discharge Orders     None         Smitty Knudsen, PA-C 11/26/22 1929    Melene Plan, DO 11/26/22 2141
